# Patient Record
Sex: Female | Born: 1970
Health system: Southern US, Community
[De-identification: ages and names within clinical notes are randomized; demographics above are authoritative.]

## PROBLEM LIST (undated history)

## (undated) DIAGNOSIS — K5090 Crohn's disease, unspecified, without complications: Secondary | ICD-10-CM

## (undated) DIAGNOSIS — T7840XA Allergy, unspecified, initial encounter: Secondary | ICD-10-CM

## (undated) DIAGNOSIS — F1010 Alcohol abuse, uncomplicated: Secondary | ICD-10-CM

## (undated) HISTORY — DX: Allergy, unspecified, initial encounter: T78.40XA

## (undated) HISTORY — DX: Alcohol abuse, uncomplicated: F10.10

## (undated) HISTORY — PX: COLONOSCOPY: SHX174

## (undated) HISTORY — DX: Crohn's disease, unspecified, without complications: K50.90

---

## 2000-04-21 ENCOUNTER — Other Ambulatory Visit: Admission: RE | Admit: 2000-04-21 | Discharge: 2000-04-21 | Payer: Self-pay | Admitting: Gynecology

## 2002-06-07 ENCOUNTER — Other Ambulatory Visit: Admission: RE | Admit: 2002-06-07 | Discharge: 2002-06-07 | Payer: Self-pay | Admitting: Gynecology

## 2003-06-13 ENCOUNTER — Encounter: Admission: RE | Admit: 2003-06-13 | Discharge: 2003-06-28 | Payer: Self-pay | Admitting: Family Medicine

## 2006-05-21 ENCOUNTER — Other Ambulatory Visit: Admission: RE | Admit: 2006-05-21 | Discharge: 2006-05-21 | Payer: Self-pay | Admitting: Gynecology

## 2008-09-07 ENCOUNTER — Encounter: Admission: RE | Admit: 2008-09-07 | Discharge: 2008-09-07 | Payer: Self-pay | Admitting: Gynecology

## 2010-11-15 ENCOUNTER — Encounter
Admission: RE | Admit: 2010-11-15 | Discharge: 2010-11-15 | Payer: Self-pay | Source: Home / Self Care | Attending: Gynecology | Admitting: Gynecology

## 2014-01-23 ENCOUNTER — Ambulatory Visit: Payer: Commercial Managed Care - PPO | Admitting: Family Medicine

## 2014-01-23 VITALS — BP 128/82 | HR 73 | Temp 98.3°F | Resp 17 | Ht 70.0 in | Wt 165.0 lb

## 2014-01-23 DIAGNOSIS — H9202 Otalgia, left ear: Secondary | ICD-10-CM

## 2014-01-23 DIAGNOSIS — J329 Chronic sinusitis, unspecified: Secondary | ICD-10-CM

## 2014-01-23 DIAGNOSIS — J069 Acute upper respiratory infection, unspecified: Secondary | ICD-10-CM

## 2014-01-23 DIAGNOSIS — H9209 Otalgia, unspecified ear: Secondary | ICD-10-CM

## 2014-01-23 MED ORDER — AZITHROMYCIN 250 MG PO TABS: ORAL_TABLET | ORAL | Status: DC

## 2014-01-23 NOTE — Progress Notes (Signed)
      Chief Complaint:  Chief Complaint  Patient presents with  . Sinusitis  . URI  . Cough    HPI: Helen [name] is a 43 y.o. female who is here for  Productive cough yellow, subjective chills/fevers, left ear pressure, and sinus pressure, HA x 2 weeks. Worsening  sxs so came in. She has used mucinex, has used netty pot. Has allergies. Former smoker, last used 4 weeks ago, she quit with acupuncture. She has Crohns disease, has alcohol abuse issues so will not give any narcotics. She has a roommate with similar sxs. NO sore throat. Tried flushing ear out with hydroen peroxide  Past Medical History  Diagnosis Date  . Allergy   . Crohn disease   . Alcohol abuse    Past Surgical History  Procedure Laterality Date  . Colonoscopy     History   Social History  . Marital Status: Single    Spouse Name: N/A    Number of Children: N/A  . Years of Education: N/A   Social History Main Topics  . Smoking status: Former Smoker    Start date: 01/23/1989    Quit date: 10/25/2013  . Smokeless tobacco: None  . Alcohol Use: No  . Drug Use: No  . Sexual Activity: No   Other Topics Concern  . None   Social History Narrative  . None   Family History  Problem Relation Age of Onset  . Mental illness Father    No Known Allergies Prior to Admission medications   Not on File     ROS: The patient denies  night sweats, unintentional weight loss, chest pain, palpitations, wheezing, dyspnea on exertion, nausea, vomiting, abdominal pain, dysuria, hematuria, melena, numbness, weakness, or tingling.   All other systems have been reviewed and were otherwise negative with the exception of those mentioned in the HPI and as above.    PHYSICAL EXAM: Filed Vitals:   01/23/14 0853  BP: 128/82  Pulse: 73  Temp: 98.3 F (36.8 C)  Resp: 17  Spo2 98% Filed Vitals:   01/23/14 0853  Height: 5' 10"  (1.778 m)  Weight: 165 lb (74.844 kg)   Body mass index is 23.68 kg/(m^2).  General: Alert,  no acute distress HEENT:  Normocephalic, atraumatic, oropharynx patent. EOMI, PERRLA, LEft Tm slight erythematous, + sinus tenderness, no exudates Cardiovascular:  Regular rate and rhythm, no rubs murmurs or gallops.  No Carotid bruits, radial pulse intact. No pedal edema.  Respiratory: Clear to auscultation bilaterally.  No wheezes, rales, or rhonchi.  No cyanosis, no use of accessory musculature GI: No organomegaly, abdomen is soft and non-tender, positive bowel sounds.  No masses. Skin: No rashes. Neurologic: Facial musculature symmetric. Psychiatric: Patient is appropriate throughout our interaction. Lymphatic: No cervical lymphadenopathy Musculoskeletal: Gait intact.   LABS: No results found for this or any previous visit.   EKG/XRAY:   Primary read interpreted by Dr. Marin Comment at [facility].   ASSESSMENT/PLAN: Encounter Diagnoses  Name Primary?  . Sinusitis Yes  . URI (upper respiratory infection)   . Otalgia of left ear    Rx Azithromycin Gave sampel of nasonex otc cough meds, no narcotics please! F/u prn  Gross sideeffects, risk and benefits, and alternatives of medications d/w patient. Patient is aware that all medications have potential sideeffects and we are unable to predict every sideeffect or drug-drug interaction that may occur.  LE, [place], DO 01/23/2014 9:48 AM

## 2014-01-23 NOTE — Patient Instructions (Signed)

## 2014-01-23 NOTE — Addendum Note (Signed)
Addended by: Glenford Bayley on: 01/23/2014 10:03 AM   Modules accepted: Orders

## 2014-05-16 ENCOUNTER — Ambulatory Visit (INDEPENDENT_AMBULATORY_CARE_PROVIDER_SITE_OTHER): Payer: Commercial Managed Care - PPO | Admitting: Physician Assistant

## 2014-05-16 VITALS — BP 106/74 | HR 68 | Temp 98.1°F | Resp 16 | Ht 69.5 in | Wt 166.0 lb

## 2014-05-16 DIAGNOSIS — J0110 Acute frontal sinusitis, unspecified: Secondary | ICD-10-CM

## 2014-05-16 DIAGNOSIS — J309 Allergic rhinitis, unspecified: Secondary | ICD-10-CM

## 2014-05-16 MED ORDER — AMOXICILLIN-POT CLAVULANATE 875-125 MG PO TABS: 1.0000 | ORAL_TABLET | Freq: Two times a day (BID) | ORAL | Status: DC

## 2014-05-16 MED ORDER — FLUTICASONE PROPIONATE 50 MCG/ACT NA SUSP: 2.0000 | Freq: Every day | NASAL | Status: DC

## 2014-05-16 MED ORDER — CETIRIZINE HCL 10 MG PO TABS: 10.0000 mg | ORAL_TABLET | Freq: Every day | ORAL | Status: DC

## 2014-05-16 NOTE — Progress Notes (Signed)
   Subjective:    Patient ID: Helen [name], female    DOB: 10-May-1971, 43 y.o.   MRN: [id number]  HPI   Helen [name] is a very pleasant 43 yr old female here with concern for Illness.  She reports cold symptoms for 2 weeks.  Sore throat is her most persistent symptom - +post nasal drainage.  Has had some dizziness.  Slight LEFT ear pain.  Occ facial pain.  "Flashes of fever" - has not taken temp.  Occasional dry cough.  Thought perhaps this was allergies but typically only gets allergies in the fall - did not try any allergy medication.  +fatigue.  Taking tylenol for HA  Pt state she is a "Relapsing smoker" but hasn't smoked in about 3 days  Review of Systems  Constitutional: Positive for fever (subjective) and fatigue. Negative for chills.  HENT: Positive for ear pain, postnasal drip and sore throat. Negative for congestion.   Respiratory: Positive for cough. Negative for shortness of breath and wheezing.   Cardiovascular: Negative.   Gastrointestinal: Negative.   Musculoskeletal: Negative.   Skin: Negative.   Neurological: Positive for headaches.       Objective:   Physical Exam  Vitals reviewed. Constitutional: She is oriented to person, place, and time. She appears well-developed and well-nourished. No distress.  HENT:  Head: Normocephalic.  Right Ear: Tympanic membrane and ear canal normal.  Left Ear: Tympanic membrane and ear canal normal.  Nose: No mucosal edema or rhinorrhea. Right sinus exhibits frontal sinus tenderness. Right sinus exhibits no maxillary sinus tenderness. Left sinus exhibits frontal sinus tenderness. Left sinus exhibits no maxillary sinus tenderness.  Mouth/Throat: Uvula is midline and mucous membranes are normal. Posterior oropharyngeal erythema present. No oropharyngeal exudate or posterior oropharyngeal edema.  Eyes: Conjunctivae are normal. No scleral icterus.  Neck: Neck supple.  Cardiovascular: Normal rate, regular rhythm and normal heart sounds.     Pulmonary/Chest: Effort normal and breath sounds normal. She has no wheezes. She has no rales.  Abdominal: Soft. There is no tenderness.  Lymphadenopathy:    She has no cervical adenopathy.  Neurological: She is alert and oriented to person, place, and time.  Skin: Skin is warm and dry.  Psychiatric: She has a normal mood and affect. Her behavior is normal.       Assessment & Plan:  Acute frontal sinusitis, recurrence not specified - Plan: cetirizine (ZYRTEC) 10 MG tablet, fluticasone (FLONASE) 50 MCG/ACT nasal spray, amoxicillin-clavulanate (AUGMENTIN) 875-125 MG per tablet  Allergic rhinitis, cause unspecified - Plan: cetirizine (ZYRTEC) 10 MG tablet, fluticasone (FLONASE) 50 MCG/ACT nasal spray   Helen [name] is a very pleasant 43 yr old female here with 2 weeks of persistent URI symptoms.  Associated sinus pressure/pain and dizziness.  Will treat for acute sinusitis with augmentin.  Start Zyrtec and Flonase - may continue for allergy control.  Push fluids, rest.  Consider adding neti pot/nasal saline.    Pt to call or RTC if worsening or not improving  E. Natividad Brood MHS, PA-C [facility] 7/14/20158:52 PM

## 2014-05-16 NOTE — Patient Instructions (Signed)
The antibiotic prescribed today is for your present infection only. It is very important to follow the directions for the medication prescribed. Antibiotics are generally given for a specified period of time (7-10 days, for example) to be taken at specific intervals (every 4, 6, 8 or 12 hours). This is necessary to keep the right amount of the medication in the bloodstream. Too much of the medication may cause an adverse reaction, too little may not be completely effective.  To clear your infection completely, continue taking the antibiotic for the full time of treatment, even if you begin to feel better after a few days.  If you miss a dose of the antibiotic, take it as soon as possible. Then go back to your regular dosing schedule. However, don't double up doses.    Take the Augmentin as directed.  Be sure to finish the full course even when you start feeling better.  Take with food to reduce stomach upset  Take the cetirizine (Zyrtec) once daily - this will help dry up post-nasal drainage.  Also use the fluticasone (Flonase) 2 sprays each nostril once daily - this will help with sinus inflammation and ear pressure.  I would continue these medications for a couple of weeks.  You can also use both of these in the fall for allergy symptoms  Warm salt water gargles can soothe the throat.    Ibuprofen and/or Tylenol if needed for pain relief  Plenty of fluids (water is best!) and rest  Please let us know if any symptoms are worsening or not improving   Sinusitis Sinusitis is redness, soreness, and swelling (inflammation) of the paranasal sinuses. Paranasal sinuses are air pockets within the bones of your face (beneath the eyes, the middle of the forehead, or above the eyes). In healthy paranasal sinuses, mucus is able to drain out, and air is able to circulate through them by way of your nose. However, when your paranasal sinuses are inflamed, mucus and air can become trapped. This can allow bacteria  and other germs to grow and cause infection. Sinusitis can develop quickly and last only a short time (acute) or continue over a long period (chronic). Sinusitis that lasts for more than 12 weeks is considered chronic.  CAUSES  Causes of sinusitis include:  Allergies.  Structural abnormalities, such as displacement of the cartilage that separates your nostrils (deviated septum), which can decrease the air flow through your nose and sinuses and affect sinus drainage.  Functional abnormalities, such as when the small hairs (cilia) that line your sinuses and help remove mucus do not work properly or are not present. SYMPTOMS  Symptoms of acute and chronic sinusitis are the same. The primary symptoms are pain and pressure around the affected sinuses. Other symptoms include:  Upper toothache.  Earache.  Headache.  Bad breath.  Decreased sense of smell and taste.  A cough, which worsens when you are lying flat.  Fatigue.  Fever.  Thick drainage from your nose, which often is green and may contain pus (purulent).  Swelling and warmth over the affected sinuses. DIAGNOSIS  Your caregiver will perform a physical exam. During the exam, your caregiver may:  Look in your nose for signs of abnormal growths in your nostrils (nasal polyps).  Tap over the affected sinus to check for signs of infection.  View the inside of your sinuses (endoscopy) with a special imaging device with a light attached (endoscope), which is inserted into your sinuses. If your caregiver suspects that you  have chronic sinusitis, one or more of the following tests may be recommended:  Allergy tests.  Nasal culture--A sample of mucus is taken from your nose and sent to a lab and screened for bacteria.  Nasal cytology--A sample of mucus is taken from your nose and examined by your caregiver to determine if your sinusitis is related to an allergy. TREATMENT  Most cases of acute sinusitis are related to a viral  infection and will resolve on their own within 10 days. Sometimes medicines are prescribed to help relieve symptoms (pain medicine, decongestants, nasal steroid sprays, or saline sprays).  However, for sinusitis related to a bacterial infection, your caregiver will prescribe antibiotic medicines. These are medicines that will help kill the bacteria causing the infection.  Rarely, sinusitis is caused by a fungal infection. In theses cases, your caregiver will prescribe antifungal medicine. For some cases of chronic sinusitis, surgery is needed. Generally, these are cases in which sinusitis recurs more than 3 times per year, despite other treatments. HOME CARE INSTRUCTIONS   Drink plenty of water. Water helps thin the mucus so your sinuses can drain more easily.  Use a humidifier.  Inhale steam 3 to 4 times a day (for example, sit in the bathroom with the shower running).  Apply a warm, moist washcloth to your face 3 to 4 times a day, or as directed by your caregiver.  Use saline nasal sprays to help moisten and clean your sinuses.  Take over-the-counter or prescription medicines for pain, discomfort, or fever only as directed by your caregiver. SEEK IMMEDIATE MEDICAL CARE IF:  You have increasing pain or severe headaches.  You have nausea, vomiting, or drowsiness.  You have swelling around your face.  You have vision problems.  You have a stiff neck.  You have difficulty breathing. MAKE SURE YOU:   Understand these instructions.  Will watch your condition.  Will get help right away if you are not doing well or get worse. Document Released: 10/20/2005 Document Revised: 01/12/2012 Document Reviewed: 11/04/2011 [facility] Patient Information 2015 [place], [place]. This information is not intended to replace advice given to you by your health care provider. Make sure you discuss any questions you have with your health care provider.

## 2014-11-14 ENCOUNTER — Ambulatory Visit (INDEPENDENT_AMBULATORY_CARE_PROVIDER_SITE_OTHER): Payer: Commercial Managed Care - PPO | Admitting: Physician Assistant

## 2014-11-14 VITALS — BP 116/68 | HR 68 | Temp 98.1°F | Resp 18 | Ht 70.5 in | Wt 173.0 lb

## 2014-11-14 DIAGNOSIS — R05 Cough: Secondary | ICD-10-CM

## 2014-11-14 DIAGNOSIS — R058 Other specified cough: Secondary | ICD-10-CM

## 2014-11-14 DIAGNOSIS — R0981 Nasal congestion: Secondary | ICD-10-CM

## 2014-11-14 MED ORDER — IPRATROPIUM BROMIDE 0.03 % NA SOLN: 2.0000 | Freq: Two times a day (BID) | NASAL | Status: DC

## 2014-11-14 MED ORDER — AZITHROMYCIN 250 MG PO TABS: ORAL_TABLET | ORAL | Status: DC

## 2014-11-14 NOTE — Patient Instructions (Signed)
Take antibiotic until finished. Take mucinex regularly. Use nasal spray twice a day. Return in 7-10 days if symptoms are not improving.

## 2014-11-14 NOTE — Progress Notes (Signed)
Subjective:    Patient ID: Helen [name], female    DOB: 14-Aug-1971, 44 y.o.   MRN: [id number]  HPI  This is a 44 year old female with PMH allergic rhinitis who is presenting with 14 days of unchanging productive cough. Cough is worse in the mornings and evenings. It is productive of white/yellow sputum. Nasal congestion started 4 days ago. She does not have nasal drainage. She reports her ears feel full. She has tried acupuncture and mucinex. She has not taken the mucinex consistently. She is a former smoker - most recently quit 2 months ago. She has struggled with quitting for the past 5 years. She does not have a history of asthma. She has environmental allergies in the fall - not bothering her now. She denies facial pain, sore throat, fever, chills, wheezing or SOB.  Review of Systems  Constitutional: Positive for fatigue. Negative for fever and chills.  HENT: Positive for congestion. Negative for ear pain, sinus pressure and sore throat.   Eyes: Negative for redness.  Respiratory: Positive for cough. Negative for shortness of breath and wheezing.   Gastrointestinal: Negative for nausea, vomiting and diarrhea.  Skin: Negative for rash.  Allergic/Immunologic: Positive for environmental allergies.  Hematological: Negative for adenopathy.  Psychiatric/Behavioral: Negative for sleep disturbance.     There are no active problems to display for this patient.  Prior to Admission medications   Medication Sig Start Date End Date Taking? Authorizing Provider  guaiFENesin (MUCINEX) 600 MG 12 hr tablet Take by mouth 2 (two) times daily.   Yes Historical Provider, MD                 No Known Allergies  Patient's social and family history were reviewed.     Objective:   Physical Exam  Constitutional: She is oriented to person, place, and time. She appears well-developed and well-nourished. No distress.  HENT:  Head: Normocephalic and atraumatic.  Right Ear: Hearing, tympanic membrane,  external ear and ear canal normal.  Left Ear: Hearing, tympanic membrane, external ear and ear canal normal.  Nose: Nose normal. Right sinus exhibits no maxillary sinus tenderness and no frontal sinus tenderness. Left sinus exhibits no maxillary sinus tenderness and no frontal sinus tenderness.  Mouth/Throat: Uvula is midline and mucous membranes are normal. Posterior oropharyngeal erythema present. No oropharyngeal exudate or posterior oropharyngeal edema.  Eyes: Conjunctivae and lids are normal. Right eye exhibits no discharge. Left eye exhibits no discharge. No scleral icterus.  Cardiovascular: Normal rate, regular rhythm, normal heart sounds, intact distal pulses and normal pulses.   No murmur heard. Pulmonary/Chest: Effort normal and breath sounds normal. No respiratory distress. She has no wheezes. She has no rhonchi. She has no rales.  Musculoskeletal: Normal range of motion.  Lymphadenopathy:       Head (right side): No submental, no submandibular, no tonsillar, no preauricular and no posterior auricular adenopathy present.       Head (left side): No submental, no submandibular, no tonsillar, no preauricular and no posterior auricular adenopathy present.    She has no cervical adenopathy.  Neurological: She is alert and oriented to person, place, and time.  Skin: Skin is warm, dry and intact. No lesion and no rash noted.  Psychiatric: She has a normal mood and affect. Her speech is normal and behavior is normal. Thought content normal.   BP 116/68 mmHg  Pulse 68  Temp(Src) 98.1 F (36.7 C) (Oral)  Resp 18  Ht 5' 10.5" (1.791 m)  Wt 173 lb (78.472 kg)  BMI 24.46 kg/m2  SpO2 99%  LMP 11/04/2014     Assessment & Plan:  1. Productive cough 2. Nasal congestion Exam normal however due to 14 day duration of unchanging productive cough, will tx with abx. Atrovent for nasal congestion. Will return in 7-10 days if symptoms worsen or fail to improve.  - azithromycin (ZITHROMAX) 250 MG  tablet; Take 2 tabs PO x 1 dose, then 1 tab PO QD x 4 days  Dispense: 6 tablet; Refill: 0 - ipratropium (ATROVENT) 0.03 % nasal spray; Place 2 sprays into both nostrils 2 (two) times daily.  Dispense: 30 mL; Refill: 0   Defne Gerling V. Drenda Freeze, MHS [facility]  11/14/2014

## 2014-11-16 ENCOUNTER — Telehealth: Payer: Self-pay | Admitting: Physician Assistant

## 2014-11-16 ENCOUNTER — Ambulatory Visit (INDEPENDENT_AMBULATORY_CARE_PROVIDER_SITE_OTHER): Payer: Commercial Managed Care - PPO | Admitting: Family Medicine

## 2014-11-16 VITALS — BP 94/70 | HR 60 | Temp 98.5°F | Resp 16 | Ht 70.5 in | Wt 174.2 lb

## 2014-11-16 DIAGNOSIS — R05 Cough: Secondary | ICD-10-CM

## 2014-11-16 DIAGNOSIS — J069 Acute upper respiratory infection, unspecified: Secondary | ICD-10-CM

## 2014-11-16 DIAGNOSIS — R059 Cough, unspecified: Secondary | ICD-10-CM

## 2014-11-16 NOTE — Telephone Encounter (Signed)
Is there a cough suppressant we can call in  Please advise

## 2014-11-16 NOTE — Progress Notes (Signed)
Chief Complaint:  Chief Complaint  Patient presents with  . Follow-up    Cough has gotten worse    HPI: Helen [name] is a 44 y.o. female who is here for recheck on cough, on z pack x 2 days, had coughing spell that made her sob, no wheezing, then went away, she was worried there may be something we are missing. She also felt slightly  Nauseated. She has not really tried ,uch except for mucinex, has an alcohol hx so does not want anything with narcotics in it. She has not tried nasal sprays. She is toerating the z pack well. No fevers or chills. Everythign is better except for cough x 1 episode that scared her   Past Medical History  Diagnosis Date  . Allergy   . Crohn disease   . Alcohol abuse    Past Surgical History  Procedure Laterality Date  . Colonoscopy     History   Social History  . Marital Status: Single    Spouse Name: N/A    Number of Children: N/A  . Years of Education: N/A   Social History Main Topics  . Smoking status: Former Smoker    Types: Cigarettes    Start date: 01/23/1989    Quit date: 10/25/2013  . Smokeless tobacco: Never Used  . Alcohol Use: No  . Drug Use: No  . Sexual Activity: No   Other Topics Concern  . None   Social History Narrative   Family History  Problem Relation Age of Onset  . Mental illness Father    No Known Allergies Prior to Admission medications   Medication Sig Start Date End Date Taking? Authorizing Provider  azithromycin (ZITHROMAX) 250 MG tablet Take 2 tabs PO x 1 dose, then 1 tab PO QD x 4 days 11/14/14  Yes Bennett Scrape V, PA-C  guaiFENesin (MUCINEX) 600 MG 12 hr tablet Take by mouth 2 (two) times daily.   Yes Historical Provider, MD  ipratropium (ATROVENT) 0.03 % nasal spray Place 2 sprays into both nostrils 2 (two) times daily. 11/14/14  Yes Bennett Scrape V, PA-C     ROS: The patient denies fevers, chills, night sweats, unintentional weight loss, chest pain, palpitations, wheezing, dyspnea on exertion,  nausea, vomiting, abdominal pain, dysuria, hematuria, melena, numbness, weakness, or tingling.  All other systems have been reviewed and were otherwise negative with the exception of those mentioned in the HPI and as above.    PHYSICAL EXAM: Filed Vitals:   11/16/14 1904  BP: 94/70  Pulse: 60  Temp: 98.5 F (36.9 C)  Resp: 16   Filed Vitals:   11/16/14 1904  Height: 5' 10.5" (1.791 m)  Weight: 174 lb 4 oz (79.039 kg)   Body mass index is 24.64 kg/(m^2).  General: Alert, no acute distress HEENT:  Normocephalic, atraumatic, oropharynx patent. EOMI, PERRLA Erythematous throat, no exudates, TM normal, + sinus tenderness, + erythematous/boggy nasal mucosa Cardiovascular:  Regular rate and rhythm, no rubs murmurs or gallops.  No Carotid bruits, radial pulse intact. No pedal edema.  Respiratory: Clear to auscultation bilaterally.  No wheezes, rales, or rhonchi.  No cyanosis, no use of accessory musculature GI: No organomegaly, abdomen is soft and non-tender, positive bowel sounds.  No masses. Skin: No rashes. Neurologic: Facial musculature symmetric. Psychiatric: Patient is appropriate throughout our interaction. Lymphatic: No cervical lymphadenopathy Musculoskeletal: Gait intact.   LABS: No results found for this or any previous visit.   EKG/XRAY:   Primary read  interpreted by Dr. Marin Comment at [facility].   ASSESSMENT/PLAN: Encounter Diagnoses  Name Primary?  . Acute upper respiratory infection Yes  . Cough    VSS, discussed option of gettign xray although she was moving air well and no abnl sounds in lungs  on PE.  She decieded not to get xray , will return if worsening sxs Recommend Delsyn, nasocort otc  She does not want any rx narcotic cough syrup F/u prn  Gross sideeffects, risk and benefits, and alternatives of medications d/w patient. Patient is aware that all medications have potential sideeffects and we are unable to predict every sideeffect or drug-drug interaction that  may occur.  Thaddus Mcdowell, [place], DO 11/16/2014 8:06 PM

## 2014-11-16 NOTE — Telephone Encounter (Signed)
Patient states that she is coughing so hard that she is gagging. What can she do?  [phone]

## 2014-11-18 MED ORDER — GUAIFENESIN-CODEINE 100-10 MG/5ML PO SOLN: 5.0000 mL | Freq: Four times a day (QID) | ORAL | Status: DC | PRN

## 2014-11-18 MED ORDER — HYDROCOD POLST-CHLORPHEN POLST 10-8 MG/5ML PO LQCR: 5.0000 mL | Freq: Two times a day (BID) | ORAL | Status: DC | PRN

## 2014-11-18 NOTE — Telephone Encounter (Signed)
Sent in [place] for her cough.

## 2014-11-19 NOTE — Telephone Encounter (Signed)
Pt was seen in office on 11/16/14

## 2014-12-13 ENCOUNTER — Other Ambulatory Visit: Payer: Self-pay | Admitting: Obstetrics and Gynecology

## 2014-12-13 DIAGNOSIS — R928 Other abnormal and inconclusive findings on diagnostic imaging of breast: Secondary | ICD-10-CM

## 2014-12-20 ENCOUNTER — Ambulatory Visit
Admission: RE | Admit: 2014-12-20 | Discharge: 2014-12-20 | Disposition: A | Discharge status: Home / Self Care | Payer: Commercial Managed Care - PPO | Source: Ambulatory Visit | Attending: Obstetrics and Gynecology | Admitting: Obstetrics and Gynecology

## 2014-12-20 DIAGNOSIS — R928 Other abnormal and inconclusive findings on diagnostic imaging of breast: Secondary | ICD-10-CM

## 2015-12-08 ENCOUNTER — Ambulatory Visit (INDEPENDENT_AMBULATORY_CARE_PROVIDER_SITE_OTHER): Payer: Commercial Managed Care - PPO | Admitting: Family Medicine

## 2015-12-08 ENCOUNTER — Encounter: Payer: Self-pay | Admitting: Family Medicine

## 2015-12-08 VITALS — BP 126/84 | HR 65 | Temp 98.8°F | Resp 16 | Ht 71.0 in | Wt 161.0 lb

## 2015-12-08 DIAGNOSIS — R5382 Chronic fatigue, unspecified: Secondary | ICD-10-CM

## 2015-12-08 DIAGNOSIS — R519 Headache, unspecified: Secondary | ICD-10-CM

## 2015-12-08 DIAGNOSIS — H9313 Tinnitus, bilateral: Secondary | ICD-10-CM | POA: Diagnosis not present

## 2015-12-08 DIAGNOSIS — R51 Headache: Secondary | ICD-10-CM | POA: Diagnosis not present

## 2015-12-08 LAB — POCT CBC
Granulocyte percent: 81 %G — AB (ref 37–80)
HCT, POC: 38.2 % (ref 37.7–47.9)
Hemoglobin: 13.7 g/dL (ref 12.2–16.2)
LYMPH, POC: 1.2 (ref 0.6–3.4)
MCH: 33.6 pg — AB (ref 27–31.2)
MCHC: 35.8 g/dL — AB (ref 31.8–35.4)
MCV: 93.9 fL (ref 80–97)
MID (CBC): 0.4 (ref 0–0.9)
MPV: 7.5 fL (ref 0–99.8)
PLATELET COUNT, POC: 193 10*3/uL (ref 142–424)
POC Granulocyte: 6.9 (ref 2–6.9)
POC LYMPH %: 14.5 % (ref 10–50)
POC MID %: 4.5 % (ref 0–12)
RBC: 4.07 M/uL (ref 4.04–5.48)
RDW, POC: 13.5 %
WBC: 8.5 10*3/uL (ref 4.6–10.2)

## 2015-12-08 LAB — COMPREHENSIVE METABOLIC PANEL
ALBUMIN: 4.1 g/dL (ref 3.6–5.1)
ALT: 13 U/L (ref 6–29)
AST: 16 U/L (ref 10–30)
Alkaline Phosphatase: 41 U/L (ref 33–115)
BUN: 8 mg/dL (ref 7–25)
CHLORIDE: 104 mmol/L (ref 98–110)
CO2: 26 mmol/L (ref 20–31)
Calcium: 8.7 mg/dL (ref 8.6–10.2)
Creat: 0.67 mg/dL (ref 0.50–1.10)
Glucose, Bld: 87 mg/dL (ref 65–99)
POTASSIUM: 4.3 mmol/L (ref 3.5–5.3)
Sodium: 137 mmol/L (ref 135–146)
TOTAL PROTEIN: 6.9 g/dL (ref 6.1–8.1)
Total Bilirubin: 0.6 mg/dL (ref 0.2–1.2)

## 2015-12-08 NOTE — Patient Instructions (Signed)
Continue to drink plenty of fluids  You should continue to get enough rest, but also tried to do some daily exercise such as walking or some active movement type exercise.  Take over-the-counter Claritin (loratadine) or Allegra (fexofenadine) 1 daily. I think this may help open the eustachian tubes up and help your ringing in the ear sensation. If that continues to persist too much we can make a referral to an ENT physician.  Return if worse or not improving  I will let you know the results of your remaining labs in a few days

## 2015-12-08 NOTE — Progress Notes (Signed)
Patient ID: Helen [name], female    DOB: 11-23-1970  Age: 45 y.o. MRN: [id number]  Chief Complaint  Patient presents with  . Fatigue    2 weeks  . Sore Throat  . Tinnitus  . Nasal Congestion    Subjective:   Patient is here for a number of things she has been just very fatigued the last several weeks. Not really a chronic fatigue type problem, just a persisting malaise and fatigue. She has a long history of mild tinnitus, but is been worse lately. She had been having a headache. She had a sore throat and nasal congestion. She does not smoke. She's not been febrile.  Current allergies, medications, problem list, past/family and social histories reviewed.  Objective:  BP 126/84 mmHg  Pulse 65  Temp(Src) 98.8 F (37.1 C) (Oral)  Resp 16  Ht 5' 11"  (1.803 m)  Wt 161 lb (73.029 kg)  BMI 22.46 kg/m2  SpO2 98%  LMP 11/18/2015  No major acute obvious problems. TMs appear entirely normal. Throat clear. Neck supple without significant nodes. Chest is clear to auscultation. Heart regular without murmurs. Abdomen soft without mass or tenderness.  Assessment & Plan:   Assessment: 1. Chronic fatigue   2. Tinnitus, bilateral   3. Nonintractable episodic headache, unspecified headache type       Plan: Generalized malaise, probably a viral syndrome. Will check labs and see if there is anything else that shows up. May consider referral to an ENT doctor.  Orders Placed This Encounter  Procedures  . Comprehensive metabolic panel  . POCT CBC    No orders of the defined types were placed in this encounter.         Patient Instructions  Continue to drink plenty of fluids  You should continue to get enough rest, but also tried to do some daily exercise such as walking or some active movement type exercise.  Take over-the-counter Claritin (loratadine) or Allegra (fexofenadine) 1 daily. I think this may help open the eustachian tubes up and help your ringing in the ear sensation.  If that continues to persist too much we can make a referral to an ENT physician.  Return if worse or not improving  I will let you know the results of your remaining labs in a few days     Results for orders placed or performed in visit on 12/08/15  POCT CBC  Result Value Ref Range   WBC 8.5 4.6 - 10.2 K/uL   Lymph, poc 1.2 0.6 - 3.4   POC LYMPH PERCENT 14.5 10 - 50 %L   MID (cbc) 0.4 0 - 0.9   POC MID % 4.5 0 - 12 %M   POC Granulocyte 6.9 2 - 6.9   Granulocyte percent 81.0 (A) 37 - 80 %G   RBC 4.07 4.04 - 5.48 M/uL   Hemoglobin 13.7 12.2 - 16.2 g/dL   HCT, POC 38.2 37.7 - 47.9 %   MCV 93.9 80 - 97 fL   MCH, POC 33.6 (A) 27 - 31.2 pg   MCHC 35.8 (A) 31.8 - 35.4 g/dL   RDW, POC 13.5 %   Platelet Count, POC 193 142 - 424 K/uL   MPV 7.5 0 - 99.8 fL     Return if symptoms worsen or fail to improve.   Salome Hautala, MD 12/08/2015

## 2016-02-11 ENCOUNTER — Other Ambulatory Visit: Payer: Self-pay

## 2016-11-03 HISTORY — PX: LAPAROSCOPIC ILEOCECECTOMY: SHX5898

## 2016-11-27 ENCOUNTER — Encounter: Payer: Self-pay | Admitting: Physician Assistant

## 2016-11-27 ENCOUNTER — Ambulatory Visit (INDEPENDENT_AMBULATORY_CARE_PROVIDER_SITE_OTHER): Payer: Commercial Managed Care - PPO

## 2016-11-27 ENCOUNTER — Ambulatory Visit (INDEPENDENT_AMBULATORY_CARE_PROVIDER_SITE_OTHER): Payer: Commercial Managed Care - PPO | Admitting: Physician Assistant

## 2016-11-27 VITALS — BP 118/80 | HR 80 | Temp 98.5°F | Resp 16 | Ht 71.0 in | Wt 162.0 lb

## 2016-11-27 DIAGNOSIS — R059 Cough, unspecified: Secondary | ICD-10-CM

## 2016-11-27 DIAGNOSIS — R05 Cough: Secondary | ICD-10-CM

## 2016-11-27 LAB — POCT CBC
GRANULOCYTE PERCENT: 72.3 % (ref 37–80)
HCT, POC: 36 % — AB (ref 37.7–47.9)
HEMOGLOBIN: 12.7 g/dL (ref 12.2–16.2)
Lymph, poc: 0.9 (ref 0.6–3.4)
MCH: 32.5 pg — AB (ref 27–31.2)
MCHC: 35.3 g/dL (ref 31.8–35.4)
MCV: 92.2 fL (ref 80–97)
MID (CBC): 0.7 (ref 0–0.9)
MPV: 7.9 fL (ref 0–99.8)
PLATELET COUNT, POC: 150 10*3/uL (ref 142–424)
POC Granulocyte: 4 (ref 2–6.9)
POC LYMPH PERCENT: 15.5 %L (ref 10–50)
POC MID %: 12.2 %M — AB (ref 0–12)
RBC: 3.91 M/uL — AB (ref 4.04–5.48)
RDW, POC: 12.9 %
WBC: 5.6 10*3/uL (ref 4.6–10.2)

## 2016-11-27 MED ORDER — NAPROXEN 500 MG PO TABS: 500.0000 mg | ORAL_TABLET | Freq: Two times a day (BID) | ORAL | 0 refills | Status: DC

## 2016-11-27 MED ORDER — DEXTROMETHORPHAN-GUAIFENESIN 5-100 MG/5ML PO LIQD: ORAL | 0 refills | Status: DC

## 2016-11-27 NOTE — Progress Notes (Signed)
11/27/2016 4:08 PM   DOB: 09-30-1971 / MRN: [id number]  SUBJECTIVE:  Helen [name] is a 46 y.o. female presenting for dry cough in the upper chest that started 4 days ago.  Complains of full body aches along with dizziness. She has tried Mucinex and Tylenol at night.  Feels that this not helping.  Has also tried aleve as well. She has been drinking "copious water." She is a recovering alcoholic and wants to avoid narcotics. Complains of losing her appetite.   No history of asthma. Smoked for 24 years but only 3 cigarettes daily.   There is no immunization history on file for this patient.   She has No Known Allergies.   She  has a past medical history of Alcohol abuse; Allergy; and Crohn disease ([place]).    She  reports that she quit smoking about 3 years ago. Her smoking use included Cigarettes. She started smoking about 27 years ago. She has never used smokeless tobacco. She reports that she does not drink alcohol or use drugs. She  reports that she does not engage in sexual activity. The patient  has a past surgical history that includes Colonoscopy.  Her family history includes Mental illness in her father.  Review of Systems  Constitutional: Positive for malaise/fatigue. Negative for fever.  Respiratory: Positive for cough. Negative for hemoptysis, sputum production, shortness of breath and wheezing.   Gastrointestinal: Negative for abdominal pain and nausea.  Skin: Negative for rash.  Neurological: Positive for dizziness. Negative for sensory change and focal weakness.    The problem list and medications were reviewed and updated by myself where necessary and exist elsewhere in the encounter.   OBJECTIVE:  BP 118/80 (BP Location: Right Arm, Patient Position: Sitting, Cuff Size: Normal)   Pulse 80   Temp 98.5 F (36.9 C) (Oral)   Resp 16   Ht 5' 11"  (1.803 m)   Wt 162 lb (73.5 kg)   LMP 11/29/2015 (Approximate)   SpO2 99%   BMI 22.59 kg/m   Physical Exam  Cardiovascular:  Normal rate and regular rhythm.   Pulmonary/Chest: Effort normal and breath sounds normal.  Musculoskeletal: Normal range of motion.    Results for orders placed or performed in visit on 11/27/16 (from the past 72 hour(s))  POCT CBC     Status: Abnormal   Collection Time: 11/27/16  3:47 PM  Result Value Ref Range   WBC 5.6 4.6 - 10.2 K/uL   Lymph, poc 0.9 0.6 - 3.4   POC LYMPH PERCENT 15.5 10 - 50 %L   MID (cbc) 0.7 0 - 0.9   POC MID % 12.2 (A) 0 - 12 %M   POC Granulocyte 4.0 2 - 6.9   Granulocyte percent 72.3 37 - 80 %G   RBC 3.91 (A) 4.04 - 5.48 M/uL   Hemoglobin 12.7 12.2 - 16.2 g/dL   HCT, POC 36.0 (A) 37.7 - 47.9 %   MCV 92.2 80 - 97 fL   MCH, POC 32.5 (A) 27 - 31.2 pg   MCHC 35.3 31.8 - 35.4 g/dL   RDW, POC 12.9 %   Platelet Count, POC 150 142 - 424 K/uL   MPV 7.9 0 - 99.8 fL    Dg Chest 2 View  Result Date: 11/27/2016 CLINICAL DATA:  Dry cough for 4 days.  All EXAM: CHEST  2 VIEW COMPARISON:  None. FINDINGS: The heart size and mediastinal contours are within normal limits. Both lungs are clear. The visualized skeletal structures are  unremarkable. IMPRESSION: No active cardiopulmonary disease. Electronically Signed   By: Misty Stanley M.D.   On: 11/27/2016 15:53    ASSESSMENT AND PLAN:  Laurelle was seen today for cough, dizziness and generalized body aches.  Diagnoses and all orders for this visit:  Cough: Exam and work up reassuring.  Advise symptomatic treatment for now.  Will see her back if symptoms fail to remiss, change or worsen.  -     DG Chest 2 View; Future -     POCT CBC -     naproxen (NAPROSYN) 500 MG tablet; Take 1 tablet (500 mg total) by mouth 2 (two) times daily with a meal. -     Dextromethorphan-Guaifenesin (DELSYM COUGH/CHEST CONGEST DM) 5-100 MG/5ML LIQD; Use as directed on the package.    The patient is advised to call or return to clinic if she does not see an improvement in symptoms, or to seek the care of the closest emergency department if  she worsens with the above plan.   Philis Fendt, MHS, PA-C [facility] 11/27/2016 4:08 PM

## 2016-11-27 NOTE — Patient Instructions (Signed)
     IF you received an x-ray today, you will receive an invoice from [facility]. Please contact [facility] at [phone] with questions or concerns regarding your invoice.   IF you received labwork today, you will receive an invoice from [facility]. Please contact [facility] at [phone] with questions or concerns regarding your invoice.   Our billing staff will not be able to assist you with questions regarding bills from these companies.  You will be contacted with the lab results as soon as they are available. The fastest way to get your results is to activate your My Chart account. Instructions are located on the last page of this paperwork. If you have not heard from us regarding the results in 2 weeks, please contact this office.     

## 2017-03-28 ENCOUNTER — Ambulatory Visit (INDEPENDENT_AMBULATORY_CARE_PROVIDER_SITE_OTHER): Payer: Commercial Managed Care - PPO | Admitting: Physician Assistant

## 2017-03-28 ENCOUNTER — Encounter: Payer: Self-pay | Admitting: Physician Assistant

## 2017-03-28 VITALS — BP 106/69 | HR 68 | Temp 98.9°F | Resp 18 | Ht 71.0 in | Wt 166.6 lb

## 2017-03-28 DIAGNOSIS — Z131 Encounter for screening for diabetes mellitus: Secondary | ICD-10-CM

## 2017-03-28 DIAGNOSIS — Z23 Encounter for immunization: Secondary | ICD-10-CM

## 2017-03-28 DIAGNOSIS — Z1329 Encounter for screening for other suspected endocrine disorder: Secondary | ICD-10-CM

## 2017-03-28 DIAGNOSIS — Z87898 Personal history of other specified conditions: Secondary | ICD-10-CM

## 2017-03-28 DIAGNOSIS — R42 Dizziness and giddiness: Secondary | ICD-10-CM

## 2017-03-28 MED ORDER — ONDANSETRON 4 MG PO TBDP: 4.0000 mg | ORAL_TABLET | Freq: Once | ORAL | Status: DC

## 2017-03-28 MED ORDER — ONDANSETRON HCL 4 MG PO TABS: 4.0000 mg | ORAL_TABLET | Freq: Three times a day (TID) | ORAL | 0 refills | Status: DC | PRN

## 2017-03-28 MED ORDER — SCOPOLAMINE 1 MG/3DAYS TD PT72: 1.0000 | MEDICATED_PATCH | TRANSDERMAL | 0 refills | Status: DC

## 2017-03-28 NOTE — Patient Instructions (Signed)
     IF you received an x-ray today, you will receive an invoice from [facility]. Please contact [facility] at [phone] with questions or concerns regarding your invoice.   IF you received labwork today, you will receive an invoice from [facility]. Please contact [facility] at [phone] with questions or concerns regarding your invoice.   Our billing staff will not be able to assist you with questions regarding bills from these companies.  You will be contacted with the lab results as soon as they are available. The fastest way to get your results is to activate your My Chart account. Instructions are located on the last page of this paperwork. If you have not heard from us regarding the results in 2 weeks, please contact this office.     

## 2017-03-28 NOTE — Progress Notes (Signed)
03/28/2017 10:14 AM   DOB: February 11, 1971 / MRN: [id number]  SUBJECTIVE:  Helen [name] is a 46 y.o. female presenting for for discussion of motion sickness.  Tells me that she is somewhat dizzy and nauseas today.  This is not a new problem. Has had this in past and was resolved with scopolamine patch and nausea medication.  This started about 3 days ago and is improving.  She has tried meclizine in the and it made her too sleepy. She had no side effects with this treatment plan. She denies HA, falls, change in vision.   Family history of breast cancer on paternal grandmother.  Has been getting mammograms yearly and does not miss these. She has never had an abnormal pap and sees GYN for this.   She sees Dr. Earlean Shawl for treatment of Chron disease thought to be in remission.   She quit smoking 3 years ago.  She smoked about 1 pack a week for about 24 years, thus roughly a 5 pack year history.    She has No Known Allergies.   She  has a past medical history of Alcohol abuse; Allergy; and Crohn disease ([place]).    She  reports that she quit smoking about 3 years ago. Her smoking use included Cigarettes. She started smoking about 28 years ago. She has never used smokeless tobacco. She reports that she does not drink alcohol or use drugs. She  reports that she does not engage in sexual activity. The patient  has a past surgical history that includes Colonoscopy.  Her family history includes Mental illness in her father.  Review of Systems  Constitutional: Negative for chills and fever.  HENT: Negative for congestion.   Cardiovascular: Negative for chest pain, palpitations and leg swelling.  Gastrointestinal: Positive for nausea.  Skin: Negative for itching and rash.  Neurological: Positive for dizziness. Negative for tingling, tremors, sensory change, speech change, focal weakness, seizures, loss of consciousness and headaches.  Psychiatric/Behavioral: Negative for depression.    The problem list and  medications were reviewed and updated by myself where necessary and exist elsewhere in the encounter.   OBJECTIVE:  BP 106/69   Pulse 68   Temp 98.9 F (37.2 C) (Oral)   Resp 18   Ht 5' 11"  (1.803 m)   Wt 166 lb 9.6 oz (75.6 kg)   LMP 03/28/2017   SpO2 97%   BMI 23.24 kg/m   Physical Exam  Constitutional: She is active.  Non-toxic appearance.  Cardiovascular: Normal rate, regular rhythm, S1 normal, S2 normal, normal heart sounds and intact distal pulses.  Exam reveals no gallop, no friction rub and no decreased pulses.   No murmur heard. Pulmonary/Chest: Effort normal. No tachypnea. She has no rales.  Abdominal: She exhibits no distension.  Musculoskeletal: She exhibits no edema.  Neurological: She is alert.  Skin: Skin is warm and dry. She is not diaphoretic. No pallor.   Lab Results  Component Value Date   WBC 5.6 11/27/2016   HGB 12.7 11/27/2016   HCT 36.0 (A) 11/27/2016   MCV 92.2 11/27/2016    Lab Results  Component Value Date   NA 137 12/08/2015   K 4.3 12/08/2015   CL 104 12/08/2015   CO2 26 12/08/2015    Lab Results  Component Value Date   CREATININE 0.67 12/08/2015    Lab Results  Component Value Date   ALT 13 12/08/2015   AST 16 12/08/2015   ALKPHOS 41 12/08/2015   BILITOT 0.6 12/08/2015  No results found for this or any previous visit (from the past 72 hour(s)).  No results found.  ASSESSMENT AND PLAN:  Helen [name] was seen today for medication.  Diagnoses and all orders for this visit:  Dizziness: BPPV with improving symptoms. She is worried that her symptoms will return and she is traveling in the next few days. She has tried meclizine in the past and this made her too sleepy. Starting scopolamine and providing zofran. She is behind on her health care screening and I am filling those today. Sees gyn for paps and breast cancer screenings.  Otherwise doing very well overall.  -     scopolamine (TRANSDERM-SCOP, 1.5 MG,) 1 MG/3DAYS; Place 1  patch (1.5 mg total) onto the skin every 3 (three) days. -     Discontinue: ondansetron (ZOFRAN-ODT) disintegrating tablet 4 mg; Take 1 tablet (4 mg total) by mouth once. -     ondansetron (ZOFRAN) 4 MG tablet; Take 1 tablet (4 mg total) by mouth every 8 (eight) hours as needed for nausea or vomiting.  History of vertigo -     Care order/instruction:  Screening for thyroid disorder -     TSH  Screening for diabetes mellitus -     Hemoglobin A1c  Need for Tdap vaccination -     Tdap vaccine greater than or equal to 7yo IM    The patient is advised to call or return to clinic if she does not see an improvement in symptoms, or to seek the care of the closest emergency department if she worsens with the above plan.   Philis Fendt, MHS, PA-C [facility] 03/28/2017 10:14 AM

## 2017-03-29 LAB — HEMOGLOBIN A1C
ESTIMATED AVERAGE GLUCOSE: 105 mg/dL
HEMOGLOBIN A1C: 5.3 % (ref 4.8–5.6)

## 2017-03-29 LAB — TSH: TSH: 3.24 u[IU]/mL (ref 0.450–4.500)

## 2017-03-30 NOTE — Progress Notes (Signed)
Please make patient aware of results via letter. In the context of her overall presentation any abnormal values are of no clinical significance.  Philis Fendt PA-C, 03/30/2017 10:38 AM

## 2017-03-31 ENCOUNTER — Encounter: Payer: Self-pay | Admitting: Radiology

## 2017-08-26 ENCOUNTER — Inpatient Hospital Stay (HOSPITAL_COMMUNITY)
Admission: EM | Admit: 2017-08-26 | Discharge: 2017-09-01 | DRG: 387 | Disposition: A | Discharge status: Home / Self Care | Payer: Commercial Managed Care - PPO | Attending: Internal Medicine | Admitting: Internal Medicine

## 2017-08-26 ENCOUNTER — Encounter (HOSPITAL_COMMUNITY): Payer: Self-pay | Admitting: Emergency Medicine

## 2017-08-26 ENCOUNTER — Emergency Department (HOSPITAL_COMMUNITY): Payer: Commercial Managed Care - PPO

## 2017-08-26 DIAGNOSIS — F17210 Nicotine dependence, cigarettes, uncomplicated: Secondary | ICD-10-CM | POA: Diagnosis present

## 2017-08-26 DIAGNOSIS — K56609 Unspecified intestinal obstruction, unspecified as to partial versus complete obstruction: Secondary | ICD-10-CM | POA: Diagnosis present

## 2017-08-26 DIAGNOSIS — R197 Diarrhea, unspecified: Secondary | ICD-10-CM | POA: Diagnosis not present

## 2017-08-26 DIAGNOSIS — K50012 Crohn's disease of small intestine with intestinal obstruction: Principal | ICD-10-CM | POA: Diagnosis present

## 2017-08-26 DIAGNOSIS — F1011 Alcohol abuse, in remission: Secondary | ICD-10-CM

## 2017-08-26 DIAGNOSIS — K50912 Crohn's disease, unspecified, with intestinal obstruction: Secondary | ICD-10-CM | POA: Diagnosis not present

## 2017-08-26 DIAGNOSIS — R110 Nausea: Secondary | ICD-10-CM

## 2017-08-26 DIAGNOSIS — K50914 Crohn's disease, unspecified, with abscess: Secondary | ICD-10-CM | POA: Diagnosis not present

## 2017-08-26 DIAGNOSIS — R1031 Right lower quadrant pain: Secondary | ICD-10-CM | POA: Diagnosis not present

## 2017-08-26 DIAGNOSIS — R112 Nausea with vomiting, unspecified: Secondary | ICD-10-CM | POA: Diagnosis not present

## 2017-08-26 DIAGNOSIS — Z87898 Personal history of other specified conditions: Secondary | ICD-10-CM | POA: Diagnosis not present

## 2017-08-26 LAB — COMPREHENSIVE METABOLIC PANEL
ALBUMIN: 4.2 g/dL (ref 3.5–5.0)
ALT: 12 U/L — ABNORMAL LOW (ref 14–54)
AST: 23 U/L (ref 15–41)
Alkaline Phosphatase: 42 U/L (ref 38–126)
Anion gap: 12 (ref 5–15)
BUN: 14 mg/dL (ref 6–20)
CHLORIDE: 105 mmol/L (ref 101–111)
CO2: 20 mmol/L — AB (ref 22–32)
Calcium: 8.9 mg/dL (ref 8.9–10.3)
Creatinine, Ser: 0.88 mg/dL (ref 0.44–1.00)
GFR calc Af Amer: >60 mL/min (ref >60)
GFR calc non Af Amer: >60 mL/min (ref >60)
GLUCOSE: 141 mg/dL — AB (ref 65–99)
POTASSIUM: 4.4 mmol/L (ref 3.5–5.1)
SODIUM: 137 mmol/L (ref 135–145)
Total Bilirubin: 1.1 mg/dL (ref 0.3–1.2)
Total Protein: 7.2 g/dL (ref 6.5–8.1)

## 2017-08-26 LAB — URINALYSIS, ROUTINE W REFLEX MICROSCOPIC
BACTERIA UA: NONE SEEN
BILIRUBIN URINE: NEGATIVE
Glucose, UA: NEGATIVE mg/dL
Ketones, ur: 20 mg/dL — AB
Leukocytes, UA: NEGATIVE
Nitrite: NEGATIVE
PROTEIN: NEGATIVE mg/dL
pH: 5 (ref 5.0–8.0)

## 2017-08-26 LAB — CBC
HCT: 33.7 % — ABNORMAL LOW (ref 36.0–46.0)
HEMATOCRIT: 37.8 % (ref 36.0–46.0)
Hemoglobin: 13.1 g/dL (ref 12.0–15.0)
Hemoglobin: 11.7 g/dL — ABNORMAL LOW (ref 12.0–15.0)
MCH: 31.8 pg (ref 26.0–34.0)
MCH: 31.9 pg (ref 26.0–34.0)
MCHC: 34.7 g/dL (ref 30.0–36.0)
MCHC: 34.7 g/dL (ref 30.0–36.0)
MCV: 91.7 fL (ref 78.0–100.0)
MCV: 91.8 fL (ref 78.0–100.0)
PLATELETS: 155 10*3/uL (ref 150–400)
Platelets: 146 10*3/uL — ABNORMAL LOW (ref 150–400)
RBC: 4.12 MIL/uL (ref 3.87–5.11)
RBC: 3.67 MIL/uL — AB (ref 3.87–5.11)
RDW: 13.4 % (ref 11.5–15.5)
RDW: 13.6 % (ref 11.5–15.5)
WBC: 20.8 10*3/uL — ABNORMAL HIGH (ref 4.0–10.5)
WBC: 15 10*3/uL — ABNORMAL HIGH (ref 4.0–10.5)

## 2017-08-26 LAB — CREATININE, SERUM
CREATININE: 0.73 mg/dL (ref 0.44–1.00)
GFR calc Af Amer: >60 mL/min (ref >60)

## 2017-08-26 LAB — I-STAT BETA HCG BLOOD, ED (MC, WL, AP ONLY)

## 2017-08-26 LAB — LIPASE, BLOOD: LIPASE: 22 U/L (ref 11–51)

## 2017-08-26 MED ORDER — IOPAMIDOL (ISOVUE-300) INJECTION 61%
100.0000 mL | Freq: Once | INTRAVENOUS | Status: AC | PRN
  Administered 2017-08-26: 100 mL via INTRAVENOUS

## 2017-08-26 MED ORDER — ADULT MULTIVITAMIN W/MINERALS CH
1.0000 | ORAL_TABLET | Freq: Every day | ORAL | Status: DC
  Administered 2017-08-27 – 2017-09-01 (×6): 1 via ORAL
  Filled 2017-08-26 (×7): qty 1

## 2017-08-26 MED ORDER — IOPAMIDOL (ISOVUE-300) INJECTION 61%
30.0000 mL | Freq: Once | INTRAVENOUS | Status: AC | PRN
  Administered 2017-08-26: 30 mL via ORAL

## 2017-08-26 MED ORDER — HYDRALAZINE HCL 20 MG/ML IJ SOLN: 10.0000 mg | Freq: Three times a day (TID) | INTRAMUSCULAR | Status: DC | PRN

## 2017-08-26 MED ORDER — ACETAMINOPHEN 325 MG PO TABS: 650.0000 mg | ORAL_TABLET | Freq: Four times a day (QID) | ORAL | Status: DC | PRN

## 2017-08-26 MED ORDER — THIAMINE HCL 100 MG/ML IJ SOLN
100.0000 mg | Freq: Every day | INTRAMUSCULAR | Status: DC
  Administered 2017-08-27: 100 mg via INTRAVENOUS
  Filled 2017-08-26 (×2): qty 2

## 2017-08-26 MED ORDER — METHYLPREDNISOLONE SODIUM SUCC 40 MG IJ SOLR
20.0000 mg | Freq: Four times a day (QID) | INTRAMUSCULAR | Status: DC
  Administered 2017-08-26 – 2017-08-27 (×3): 20 mg via INTRAVENOUS
  Administered 2017-08-27: 40 mg via INTRAVENOUS
  Administered 2017-08-27 – 2017-09-01 (×19): 20 mg via INTRAVENOUS
  Filled 2017-08-26 (×22): qty 1

## 2017-08-26 MED ORDER — DEXTROSE 5 % IV SOLN
2.0000 g | INTRAVENOUS | Status: DC
  Administered 2017-08-27 – 2017-09-01 (×6): 2 g via INTRAVENOUS
  Filled 2017-08-26 (×6): qty 2

## 2017-08-26 MED ORDER — FOLIC ACID 1 MG PO TABS
1.0000 mg | ORAL_TABLET | Freq: Every day | ORAL | Status: DC
  Administered 2017-08-27 – 2017-09-01 (×6): 1 mg via ORAL
  Filled 2017-08-26 (×7): qty 1

## 2017-08-26 MED ORDER — DEXTROSE 5 % IV SOLN
2.0000 g | Freq: Once | INTRAVENOUS | Status: AC
  Administered 2017-08-26: 2 g via INTRAVENOUS
  Filled 2017-08-26: qty 2

## 2017-08-26 MED ORDER — FENTANYL CITRATE (PF) 100 MCG/2ML IJ SOLN
50.0000 ug | Freq: Once | INTRAMUSCULAR | Status: AC
  Administered 2017-08-26: 50 ug via INTRAVENOUS
  Filled 2017-08-26: qty 2

## 2017-08-26 MED ORDER — DIPHENHYDRAMINE HCL 50 MG/ML IJ SOLN
25.0000 mg | Freq: Two times a day (BID) | INTRAMUSCULAR | Status: DC | PRN
Start: 2017-08-26 — End: 2017-08-31
  Administered 2017-08-26 – 2017-08-30 (×5): 25 mg via INTRAVENOUS
  Filled 2017-08-26 (×5): qty 1

## 2017-08-26 MED ORDER — HYDROMORPHONE HCL 1 MG/ML IJ SOLN: 1.0000 mg | INTRAMUSCULAR | Status: DC | PRN

## 2017-08-26 MED ORDER — LORAZEPAM 1 MG PO TABS: 1.0000 mg | ORAL_TABLET | Freq: Four times a day (QID) | ORAL | Status: DC | PRN

## 2017-08-26 MED ORDER — HEPARIN SODIUM (PORCINE) 5000 UNIT/ML IJ SOLN
5000.0000 [IU] | Freq: Three times a day (TID) | INTRAMUSCULAR | Status: DC
  Administered 2017-08-27 – 2017-08-28 (×3): 5000 [IU] via SUBCUTANEOUS
  Filled 2017-08-26 (×8): qty 1

## 2017-08-26 MED ORDER — METRONIDAZOLE IN NACL 5-0.79 MG/ML-% IV SOLN
500.0000 mg | Freq: Three times a day (TID) | INTRAVENOUS | Status: DC
  Administered 2017-08-26 – 2017-09-01 (×18): 500 mg via INTRAVENOUS
  Filled 2017-08-26 (×19): qty 100

## 2017-08-26 MED ORDER — VITAMIN B-1 100 MG PO TABS
100.0000 mg | ORAL_TABLET | Freq: Every day | ORAL | Status: DC
  Administered 2017-08-28 – 2017-09-01 (×5): 100 mg via ORAL
  Filled 2017-08-26 (×7): qty 1

## 2017-08-26 MED ORDER — ONDANSETRON HCL 4 MG/2ML IJ SOLN
4.0000 mg | Freq: Once | INTRAMUSCULAR | Status: AC | PRN
  Administered 2017-08-26: 4 mg via INTRAVENOUS
  Filled 2017-08-26: qty 2

## 2017-08-26 MED ORDER — SODIUM CHLORIDE 0.9 % IV SOLN
INTRAVENOUS | Status: AC
  Administered 2017-08-26: 17:00:00 via INTRAVENOUS

## 2017-08-26 MED ORDER — SODIUM CHLORIDE 0.9 % IV BOLUS (SEPSIS)
1000.0000 mL | Freq: Once | INTRAVENOUS | Status: AC
  Administered 2017-08-26: 1000 mL via INTRAVENOUS

## 2017-08-26 MED ORDER — ACETAMINOPHEN 650 MG RE SUPP: 650.0000 mg | Freq: Four times a day (QID) | RECTAL | Status: DC | PRN

## 2017-08-26 MED ORDER — ONDANSETRON HCL 4 MG/2ML IJ SOLN: 4.0000 mg | Freq: Four times a day (QID) | INTRAMUSCULAR | Status: DC | PRN

## 2017-08-26 MED ORDER — METRONIDAZOLE IN NACL 5-0.79 MG/ML-% IV SOLN
500.0000 mg | Freq: Once | INTRAVENOUS | Status: AC
  Administered 2017-08-26: 500 mg via INTRAVENOUS
  Filled 2017-08-26: qty 100

## 2017-08-26 MED ORDER — IOPAMIDOL (ISOVUE-300) INJECTION 61%
INTRAVENOUS | Status: AC
  Filled 2017-08-26: qty 30

## 2017-08-26 MED ORDER — IOPAMIDOL (ISOVUE-300) INJECTION 61%
INTRAVENOUS | Status: AC
  Administered 2017-08-26: 100 mL via INTRAVENOUS
  Filled 2017-08-26: qty 100

## 2017-08-26 MED ORDER — LORAZEPAM 2 MG/ML IJ SOLN: 1.0000 mg | Freq: Four times a day (QID) | INTRAMUSCULAR | Status: DC | PRN

## 2017-08-26 NOTE — ED Notes (Signed)
ED Provider at bedside. 

## 2017-08-26 NOTE — ED Provider Notes (Addendum)
[facility] DEPT Provider Note   CSN: [id number] Arrival date & time: 08/26/17  4944     History   Chief Complaint Chief Complaint  Patient presents with  . Abdominal Pain    HPI Helen [name] is a 46 y.o. female.  HPI Patient presents with abdominal pain. Began across the midabdomen yesterday. Not localized right lower quadrant. States it is relentless and severe. States walking hurts and is difficult for her to do. Has had vomiting and just little bit of diarrhea. Has history of Crohn's disease but states that she has been in remission since she was 46 years old. Does see Dr. Earlean Shawl for it.previous history of alcohol abuse but has not drank in 17 years. No vaginal bleeding or discharge. Denies possibility of pregnancy. Has had decreased appetite. No fevers. States she's been having dry heaves. Past Medical History:  Diagnosis Date  . Alcohol abuse   . Allergy   . Crohn disease ([place])     There are no active problems to display for this patient.   Past Surgical History:  Procedure Laterality Date  . COLONOSCOPY      OB History    No data available       Home Medications    Prior to Admission medications   Medication Sig Start Date End Date Taking? Authorizing Provider  ibuprofen (ADVIL,MOTRIN) 200 MG tablet Take 400 mg by mouth every 6 (six) hours as needed for fever, headache, mild pain, moderate pain or cramping.   Yes [provider]    Family History Family History  Problem Relation Age of Onset  . Mental illness Father   . Aneurysm Father   . Cancer Paternal Grandmother     Social History Social History  Substance Use Topics  . Smoking status: Current Every Day Smoker    Types: Cigarettes    Start date: 01/23/1989    Last attempt to quit: 10/25/2013  . Smokeless tobacco: Never Used  . Alcohol use No     Comment: former      Allergies   Other   Review of Systems Review of Systems  Constitutional:  Positive for appetite change.  HENT: Negative for congestion.   Respiratory: Negative for shortness of breath.   Cardiovascular: Negative for chest pain.  Gastrointestinal: Positive for abdominal pain, diarrhea, nausea and vomiting.  Genitourinary: Negative for dysuria.  Musculoskeletal: Negative for back pain.  Skin: Negative for rash.  Neurological: Negative for weakness and numbness.  Psychiatric/Behavioral: Negative for confusion.     Physical Exam Updated Vital Signs BP 122/64   Pulse 75   Temp 98 F (36.7 C) (Oral)   Resp 15   Ht 5' 11"  (1.803 m)   Wt 72.6 kg (160 lb)   LMP 07/29/2017 (Approximate)   SpO2 99%   BMI 22.32 kg/m   Physical Exam  Constitutional: She appears well-developed.  HENT:  Head: Atraumatic.  Eyes: Pupils are equal, round, and reactive to light.  Cardiovascular: Normal rate.   Pulmonary/Chest: Effort normal.  Abdominal: Soft. There is tenderness. There is guarding.  Moderate tenderness to right lower quadrant. Pain with jostling of the bed or impact to the heel of the right foot.  Musculoskeletal: She exhibits no edema.  Neurological: She is alert.  Skin: Skin is warm. Capillary refill takes less than 2 seconds.     ED Treatments / Results  Labs (all labs ordered are listed, but only abnormal results are displayed) Labs Reviewed  COMPREHENSIVE METABOLIC PANEL -  Abnormal; Notable for the following:       Result Value   CO2 20 (*)    Glucose, Bld 141 (*)    ALT 12 (*)    All other components within normal limits  CBC - Abnormal; Notable for the following:    WBC 20.8 (*)    Platelets 146 (*)    All other components within normal limits  URINALYSIS, ROUTINE W REFLEX MICROSCOPIC - Abnormal; Notable for the following:    Specific Gravity, Urine >1.046 (*)    Hgb urine dipstick SMALL (*)    Ketones, ur 20 (*)    Squamous Epithelial / LPF 0-5 (*)    All other components within normal limits  LIPASE, BLOOD  I-STAT BETA HCG BLOOD, ED  ([facility], [facility], [facility] ONLY)    EKG  EKG Interpretation None       Radiology Ct Abdomen Pelvis W Contrast  Addendum Date: 08/26/2017   ADDENDUM REPORT: 08/26/2017 14:55 ADDENDUM: On further review, the 9 cm rim enhancing collection containing stool in the right lower quadrant could represent fecalized material within the distal small bowel lumen, rather than an adjacent extraluminal abscess. Delayed CT images to allow passage of oral contrast material may be helpful for further evaluation of this finding. Electronically Signed   By: Earle Gell M.D.   On: 08/26/2017 14:55   Result Date: 08/26/2017 CLINICAL DATA:  Right lower quadrant pain beginning yesterday. Nausea and vomiting. Diarrhea. Crohn's disease. EXAM: CT ABDOMEN AND PELVIS WITH CONTRAST TECHNIQUE: Multidetector CT imaging of the abdomen and pelvis was performed using the standard protocol following bolus administration of intravenous contrast. CONTRAST:  100 mL Isovue-300 COMPARISON:  None. FINDINGS: Lower Chest: No acute findings. Hepatobiliary: Several low-attenuation liver lesions are seen, largest in the central right hepatic lobe adjacent to the right portal vein measuring 1.6 cm. These have nonspecific features. Gallbladder is unremarkable. Pancreas:  No mass or inflammatory changes. Spleen: Within normal limits in size and appearance. Adrenals/Urinary Tract: No masses identified. No evidence of hydronephrosis. Stomach/Bowel: A distal small bowel obstruction is seen with transition point in the terminal ileum. Moderate wall thickening and mucosal enhancement is seen involving terminal ileum with adjacent inflammatory changes, consistent with Crohn's disease. In addition, there is a rim enhancing fluid collection in the right lower quadrant which contains stool and measures 9.3 by 5.3 x 4.6 cm. This is consistent with abscess. Vascular/Lymphatic: No pathologically enlarged lymph nodes. No abdominal aortic aneurysm. Reproductive: No mass or  other significant abnormality. Tiny amount of free fluid noted in pelvis. Other:  None. Musculoskeletal:  No suspicious bone lesions identified. IMPRESSION: Distal small bowel obstruction due to moderate wall thickening and inflammatory changes involving the terminal ileum, consistent with active Crohn's disease. 9 x 5 x 5 cm rim enhancing collection containing stool in right lower quadrant, consistent with abscess. Several indeterminate low-attenuation liver lesions, largest measuring 1.6 cm. Nonemergent abdomen MRI without and with contrast is recommended for further characterization. Electronically Signed: By: Earle Gell M.D. On: 08/26/2017 12:15    Procedures Procedures (including critical care time)  Medications Ordered in ED Medications  iopamidol (ISOVUE-300) 61 % injection (not administered)  ondansetron (ZOFRAN) injection 4 mg (4 mg Intravenous Given 08/26/17 0818)  fentaNYL (SUBLIMAZE) injection 50 mcg (50 mcg Intravenous Given 08/26/17 0818)  sodium chloride 0.9 % bolus 1,000 mL (0 mLs Intravenous Stopped 08/26/17 0929)  cefTRIAXone (ROCEPHIN) 2 g in dextrose 5 % 50 mL IVPB (0 g Intravenous Stopped 08/26/17 0907)  And  metroNIDAZOLE (FLAGYL) IVPB 500 mg (0 mg Intravenous Stopped 08/26/17 1017)  iopamidol (ISOVUE-300) 61 % injection 30 mL (30 mLs Oral Contrast Given 08/26/17 0931)  fentaNYL (SUBLIMAZE) injection 50 mcg (50 mcg Intravenous Given 08/26/17 1022)  iopamidol (ISOVUE-300) 61 % injection 100 mL (100 mLs Intravenous Contrast Given 08/26/17 1144)  fentaNYL (SUBLIMAZE) injection 50 mcg (50 mcg Intravenous Given 08/26/17 1252)     Initial Impression / Assessment and Plan / ED Course  I have reviewed the triage vital signs and the nursing notes.  Pertinent labs & imaging results that were available during my care of the patient were reviewed by me and considered in my medical decision making (see chart for details).     Patient presents with abdominal pain.  Had had  pain for last couple days no localized right lower quadrant.  White count is elevated at 20.  CT scan shows bowel obstruction and Crohn's disease and large abscess.  Discussed with general surgery.  Patient but with the Crohn's disease recommends admission to internal medicine.  Also sees Dr. Earlean Shawl from gastroenterology.  Final Clinical Impressions(s) / ED Diagnoses   Final diagnoses:  Crohn's disease with abscess, unspecified gastrointestinal tract location [facility])  Intestinal obstruction, unspecified cause, unspecified whether partial or complete [facility])    New Prescriptions New Prescriptions   No medications on file     Davonna Belling, MD 08/26/17 1236  Has been seen by general surgery.  They are not sure this is an abscess thinks it could be obstruction.  Patient will be admitted to internal medicine.  Also discussed with Dr. Earlean Shawl, [phone] who will see the patient, in the hospital.    Davonna Belling, MD 08/26/17 [phone]

## 2017-08-26 NOTE — Progress Notes (Signed)
I talked to Dr. Earlean Shawl and let him know what was occurring and formally ask for a consult.

## 2017-08-26 NOTE — H&P (Signed)
[facility] History and Physical  [name] HDQ:[id number] DOB: [birth year] DOA: 08/26/2017  Referring physician:  PCP: Patient, No Pcp Per   Chief Complaint: "I thought it was period cramping."  HPI: Helen [name] is a 46 y.o. female with past medical history of alcohol abuse, allergies and Crohn's presents to the emergency room at the abdominal pain.  Patient states she has had abdominal pain for at least 3 days.  It has gradually worsened over the last few days.  Multiple episodes of nausea and vomiting.  Vomitus was nonbloody nonbilious.  Denies seeing contacts and suspicious ingestions.  No recent medication changes.  Denies any alcohol abuse.  Denies drinking altogether.  Pt's GI Dr. Earlean Shawl, [phone]   Review of Systems:  As per HPI otherwise 10 point review of systems negative.    Past Medical History:  Diagnosis Date  . Alcohol abuse   . Allergy   . Crohn disease [facility])    Past Surgical History:  Procedure Laterality Date  . COLONOSCOPY     Social History:  reports that she quit smoking about 3 years ago. Her smoking use included Cigarettes. She started smoking about 28 years ago. She uses smokeless tobacco. She reports that she does not drink alcohol or use drugs.  Allergies  Allergen Reactions  . Other     Recovering alcoholic - prefers no narcotic's     Family History  Problem Relation Age of Onset  . Mental illness Father   . Aneurysm Father   . Cancer Paternal Grandmother      Prior to Admission medications   Medication Sig Start Date End Date Taking? Authorizing Provider  ibuprofen (ADVIL,MOTRIN) 200 MG tablet Take 400 mg by mouth every 6 (six) hours as needed for fever, headache, mild pain, moderate pain or cramping.   Yes [provider]   Physical Exam: Vitals:   08/26/17 1430 08/26/17 1500 08/26/17 1530 08/26/17 1600  BP: 106/67 (!) 108/57 114/83   Pulse: 74 76 76 73  Resp:  16    Temp:      TempSrc:      SpO2: 99% 100% 99%  99%  Weight:      Height:        Wt Readings from Last 3 Encounters:  08/26/17 72.6 kg (160 lb)  03/28/17 75.6 kg (166 lb 9.6 oz)  11/27/16 73.5 kg (162 lb)    General:  Appears calm and comfortable, mild/mod dehydrated Eyes:  PERRL, EOMI, normal lids, iris ENT:  grossly normal hearing, lips & tongue Neck:  no LAD, masses or thyromegaly Cardiovascular:  RRR, no m/r/g. No LE edema.  Respiratory:  CTA bilaterally, no w/r/r. Normal respiratory effort. Abdomen:  soft, RLQ fullness, no rebound, no rigidity Skin:  no rash or induration seen on limited exam Musculoskeletal:  grossly normal tone BUE/BLE Psychiatric:  grossly normal mood and affect, speech fluent and appropriate Neurologic:  CN 2-12 grossly intact, moves all extremities in coordinated fashion.          Labs on Admission:  Basic Metabolic Panel:  Recent Labs Lab 08/26/17 0735  NA 137  K 4.4  CL 105  CO2 20*  GLUCOSE 141*  BUN 14  CREATININE 0.88  CALCIUM 8.9   Liver Function Tests:  Recent Labs Lab 08/26/17 0735  AST 23  ALT 12*  ALKPHOS 42  BILITOT 1.1  PROT 7.2  ALBUMIN 4.2    Recent Labs Lab 08/26/17 0735  LIPASE 22   No results for input(s):  AMMONIA in the last 168 hours. CBC:  Recent Labs Lab 08/26/17 0735  WBC 20.8*  HGB 13.1  HCT 37.8  MCV 91.7  PLT 146*   Cardiac Enzymes: No results for input(s): CKTOTAL, CKMB, CKMBINDEX, TROPONINI in the last 168 hours.  BNP (last 3 results) No results for input(s): BNP in the last 8760 hours.  ProBNP (last 3 results) No results for input(s): PROBNP in the last 8760 hours.   Serum creatinine: 0.88 mg/dL 08/26/17 0735 Estimated creatinine clearance: 91.2 mL/min  CBG: No results for input(s): GLUCAP in the last 168 hours.  Radiological Exams on Admission: Ct Abdomen Pelvis W Contrast  Addendum Date: 08/26/2017   ADDENDUM REPORT: 08/26/2017 14:55 ADDENDUM: On further review, the 9 cm rim enhancing collection containing stool in the  right lower quadrant could represent fecalized material within the distal small bowel lumen, rather than an adjacent extraluminal abscess. Delayed CT images to allow passage of oral contrast material may be helpful for further evaluation of this finding. Electronically Signed   By: Earle Gell M.D.   On: 08/26/2017 14:55   Result Date: 08/26/2017 CLINICAL DATA:  Right lower quadrant pain beginning yesterday. Nausea and vomiting. Diarrhea. Crohn's disease. EXAM: CT ABDOMEN AND PELVIS WITH CONTRAST TECHNIQUE: Multidetector CT imaging of the abdomen and pelvis was performed using the standard protocol following bolus administration of intravenous contrast. CONTRAST:  100 mL Isovue-300 COMPARISON:  None. FINDINGS: Lower Chest: No acute findings. Hepatobiliary: Several low-attenuation liver lesions are seen, largest in the central right hepatic lobe adjacent to the right portal vein measuring 1.6 cm. These have nonspecific features. Gallbladder is unremarkable. Pancreas:  No mass or inflammatory changes. Spleen: Within normal limits in size and appearance. Adrenals/Urinary Tract: No masses identified. No evidence of hydronephrosis. Stomach/Bowel: A distal small bowel obstruction is seen with transition point in the terminal ileum. Moderate wall thickening and mucosal enhancement is seen involving terminal ileum with adjacent inflammatory changes, consistent with Crohn's disease. In addition, there is a rim enhancing fluid collection in the right lower quadrant which contains stool and measures 9.3 by 5.3 x 4.6 cm. This is consistent with abscess. Vascular/Lymphatic: No pathologically enlarged lymph nodes. No abdominal aortic aneurysm. Reproductive: No mass or other significant abnormality. Tiny amount of free fluid noted in pelvis. Other:  None. Musculoskeletal:  No suspicious bone lesions identified. IMPRESSION: Distal small bowel obstruction due to moderate wall thickening and inflammatory changes involving the  terminal ileum, consistent with active Crohn's disease. 9 x 5 x 5 cm rim enhancing collection containing stool in right lower quadrant, consistent with abscess. Several indeterminate low-attenuation liver lesions, largest measuring 1.6 cm. Nonemergent abdomen MRI without and with contrast is recommended for further characterization. Electronically Signed: By: Earle Gell M.D. On: 08/26/2017 12:15    EKG: pending  Assessment/Plan Active Problems:   Bowel obstruction (HCC)  Bowel obstruction GI consulted, will salicylate and steroid decisions to GI Gen Surg consulted MIVF NPO Continue flagyl and rocephin  Hx of ETOH abuse CIWA protocol   Code Status: FULL  DVT Prophylaxis: lovenox Family Communication: parents at bedside Disposition Plan: Pending Improvement  Status: inpt medsurg  Elwin Mocha, MD Family Medicine [facility] [email or web address] Password [email or web address]

## 2017-08-26 NOTE — Consult Note (Signed)
Reason for Consult: Right lower quadrant abscess/Crohn's disease Referring Physician: Robyn Haber GI: Dr. Earlean Shawl MBW:[id number], No Pcp Per   Helen [name] is an 46 y.o. female.  HPI: Patient is a 46 year old female with a history of Crohn's disease in remission for 17 years.  She presents with severe pain in the right lower quadrant.  Nausea and vomiting.  Workup in the emergency department shows she is afebrile, vital signs are stable.  Labs show a normal CMP except for a CO2 of 20, glucose of 141 and ALT of 12.  CBC is 20,800, hemoglobin is 13.1, hematocrit 37.8, platelets 146,000.  HCG is negative.  CT scan was read as showing a distal small bowel obstruction with a transition point in the terminal ileum.  Moderate wall thickening and mucosal enhancement seen in the terminal ileum with adjacent inflammatory changes consistent with Crohn's disease.  In addition there is a rim-enhancing fluid collection in the right lower quadrant which contains stool and measures 9.3 x 5.3 x 4.6 cm consistent with an abscess.  Review of the CT with Dr. Excell Seltzer and radiology suggest that she has a significant ileocecal obstruction and that she has allot of small bowel distension and fecalization.   We are asked to see.  Past Medical History:  Diagnosis Date  . Alcohol abuse   . Allergy   . Crohn disease [facility])     Past Surgical History:  Procedure Laterality Date  . COLONOSCOPY      Family History  Problem Relation Age of Onset  . Mental illness Father   . Aneurysm Father   . Cancer Paternal Grandmother     Social History:  reports that she has been smoking Cigarettes.  She started smoking about 28 years ago. She has never used smokeless tobacco. She reports that she does not drink alcohol or use drugs. Remote hx of ETOH:  None x 17 years Tobacco - + smokeless tobacco Drugs:  None Single lives alone, but both parents are here with her from [place].    Allergies:  Allergies  Allergen Reactions  .  Other     Recovering alcoholic - prefers no narcotic's     Prior to Admission medications   Medication Sig Start Date End Date Taking? Authorizing Provider  ibuprofen (ADVIL,MOTRIN) 200 MG tablet Take 400 mg by mouth every 6 (six) hours as needed for fever, headache, mild pain, moderate pain or cramping.   Yes [provider]      Results for orders placed or performed during the hospital encounter of 08/26/17 (from the past 48 hour(s))  Lipase, blood     Status: None   Collection Time: 08/26/17  7:35 AM  Result Value Ref Range   Lipase 22 11 - 51 U/L  Comprehensive metabolic panel     Status: Abnormal   Collection Time: 08/26/17  7:35 AM  Result Value Ref Range   Sodium 137 135 - 145 mmol/L   Potassium 4.4 3.5 - 5.1 mmol/L   Chloride 105 101 - 111 mmol/L   CO2 20 (L) 22 - 32 mmol/L   Glucose, Bld 141 (H) 65 - 99 mg/dL   BUN 14 6 - 20 mg/dL   Creatinine, Ser 0.88 0.44 - 1.00 mg/dL   Calcium 8.9 8.9 - 10.3 mg/dL   Total Protein 7.2 6.5 - 8.1 g/dL   Albumin 4.2 3.5 - 5.0 g/dL   AST 23 15 - 41 U/L   ALT 12 (L) 14 - 54 U/L   Alkaline  Phosphatase 42 38 - 126 U/L   Total Bilirubin 1.1 0.3 - 1.2 mg/dL   GFR calc non Af Amer >60 >60 mL/min   GFR calc Af Amer >60 >60 mL/min    Comment: (NOTE) The eGFR has been calculated using the CKD EPI equation. This calculation has not been validated in all clinical situations. eGFR's persistently <60 mL/min signify possible Chronic Kidney Disease.    Anion gap 12 5 - 15  CBC     Status: Abnormal   Collection Time: 08/26/17  7:35 AM  Result Value Ref Range   WBC 20.8 (H) 4.0 - 10.5 K/uL   RBC 4.12 3.87 - 5.11 MIL/uL   Hemoglobin 13.1 12.0 - 15.0 g/dL   HCT 37.8 36.0 - 46.0 %   MCV 91.7 78.0 - 100.0 fL   MCH 31.8 26.0 - 34.0 pg   MCHC 34.7 30.0 - 36.0 g/dL   RDW 13.4 11.5 - 15.5 %   Platelets 146 (L) 150 - 400 K/uL  I-Stat beta hCG blood, ED     Status: None   Collection Time: 08/26/17  7:53 AM  Result Value Ref Range    I-stat hCG, quantitative <5.0 <5 mIU/mL   Comment 3            Comment:   GEST. AGE      CONC.  (mIU/mL)   <=1 WEEK        5 - 50     2 WEEKS       50 - 500     3 WEEKS       100 - 10,000     4 WEEKS     1,000 - 30,000        FEMALE AND NON-PREGNANT FEMALE:     LESS THAN 5 mIU/mL     Ct Abdomen Pelvis W Contrast  Result Date: 08/26/2017 CLINICAL DATA:  Right lower quadrant pain beginning yesterday. Nausea and vomiting. Diarrhea. Crohn's disease. EXAM: CT ABDOMEN AND PELVIS WITH CONTRAST TECHNIQUE: Multidetector CT imaging of the abdomen and pelvis was performed using the standard protocol following bolus administration of intravenous contrast. CONTRAST:  100 mL Isovue-300 COMPARISON:  None. FINDINGS: Lower Chest: No acute findings. Hepatobiliary: Several low-attenuation liver lesions are seen, largest in the central right hepatic lobe adjacent to the right portal vein measuring 1.6 cm. These have nonspecific features. Gallbladder is unremarkable. Pancreas:  No mass or inflammatory changes. Spleen: Within normal limits in size and appearance. Adrenals/Urinary Tract: No masses identified. No evidence of hydronephrosis. Stomach/Bowel: A distal small bowel obstruction is seen with transition point in the terminal ileum. Moderate wall thickening and mucosal enhancement is seen involving terminal ileum with adjacent inflammatory changes, consistent with Crohn's disease. In addition, there is a rim enhancing fluid collection in the right lower quadrant which contains stool and measures 9.3 by 5.3 x 4.6 cm. This is consistent with abscess. Vascular/Lymphatic: No pathologically enlarged lymph nodes. No abdominal aortic aneurysm. Reproductive: No mass or other significant abnormality. Tiny amount of free fluid noted in pelvis. Other:  None. Musculoskeletal:  No suspicious bone lesions identified. IMPRESSION: Distal small bowel obstruction due to moderate wall thickening and inflammatory changes involving the  terminal ileum, consistent with active Crohn's disease. 9 x 5 x 5 cm rim enhancing collection containing stool in right lower quadrant, consistent with abscess. Several indeterminate low-attenuation liver lesions, largest measuring 1.6 cm. Nonemergent abdomen MRI without and with contrast is recommended for further characterization. Electronically Signed   By:  Earle Gell M.D.   On: 08/26/2017 12:15    Review of Systems  Constitutional: Positive for chills. Negative for diaphoresis, fever, malaise/fatigue and weight loss.  HENT: Negative.   Eyes: Negative.   Respiratory: Negative.   Cardiovascular: Negative.   Gastrointestinal: Positive for abdominal pain, nausea and vomiting. Negative for blood in stool, constipation (she has large BM's), diarrhea and melena.       She has Crohn's flares, but it is often similar to bad menstrual periods, related to menstrual periods,or with certain foods.  Genitourinary: Negative.   Musculoskeletal: Negative.   Skin: Negative.   Neurological: Negative.  Negative for weakness.  Endo/Heme/Allergies: Negative.   Psychiatric/Behavioral: Negative.    Blood pressure 122/64, pulse 75, temperature 98 F (36.7 C), temperature source Oral, resp. rate 15, height _0  (1.803 m), weight 72.6 kg (160 lb), last menstrual period 07/29/2017, SpO2 99 %. Physical Exam  Constitutional: She is oriented to person, place, and time. She appears well-developed and well-nourished. No distress.  HENT:  Head: Normocephalic and atraumatic.  Mouth/Throat: No oropharyngeal exudate.  Eyes: Right eye exhibits no discharge. Left eye exhibits no discharge. No scleral icterus.  Pupils are equal  Neck: Normal range of motion. Neck supple. No JVD present. No tracheal deviation present. No thyromegaly present.  Cardiovascular: Normal rate, regular rhythm, normal heart sounds and intact distal pulses.   No murmur heard. Respiratory: Effort normal and breath sounds normal. No respiratory  distress. She has no wheezes. She has no rales. She exhibits no tenderness.  GI: Soft. She exhibits distension (minimal ) and mass (she feels a little more swollen on the right than the left.). There is tenderness (most severe on the right side). There is no rebound and no guarding.  Musculoskeletal: She exhibits no edema or tenderness.  Lymphadenopathy:    She has no cervical adenopathy.  Neurological: She is alert and oriented to person, place, and time. No cranial nerve deficit.  Skin: Skin is warm and dry. No rash noted. She is not diaphoretic. No erythema. No pallor.  Psychiatric: She has a normal mood and affect. Her behavior is normal. Judgment and thought content normal.    Assessment/Plan: Crohn's with ileocecal obstruction Hx of Crohn's disease, dormant and off medications for some years.   Remote hx of ETOH use Hx of tobacco use  Plan:  After review of the CT, talking with the patient and examining her it is Dr. Excell Seltzer opinion that this is not an abscess, but a significant obstruction secondary to her Crohn's disease.  He recommends admission and  Initial Medical management. This may not resolve the obstruction, but it would be a reasonable first step.   Dr. Earlean Shawl has been her primary GI doctor here.  She was seen at [facility] in [place], [place] for her original treatment at age 64.  Dr. Sammuel Cooper 08/26/2017, 12:29 PM

## 2017-08-26 NOTE — ED Triage Notes (Signed)
Pt states she started having RLQ pain yesterday about 5pm  Pt states the pain has been "relentless"  Pt has had nausea and vomiting with a little bit of diarrhea  Pt has hx of crohns disease

## 2017-08-27 ENCOUNTER — Inpatient Hospital Stay (HOSPITAL_COMMUNITY): Payer: Commercial Managed Care - PPO

## 2017-08-27 DIAGNOSIS — K50914 Crohn's disease, unspecified, with abscess: Secondary | ICD-10-CM

## 2017-08-27 LAB — HIV ANTIBODY (ROUTINE TESTING W REFLEX): HIV Screen 4th Generation wRfx: NONREACTIVE

## 2017-08-27 LAB — CBC
HEMATOCRIT: 33.2 % — AB (ref 36.0–46.0)
Hemoglobin: 11.6 g/dL — ABNORMAL LOW (ref 12.0–15.0)
MCH: 32.2 pg (ref 26.0–34.0)
MCHC: 34.9 g/dL (ref 30.0–36.0)
MCV: 92.2 fL (ref 78.0–100.0)
Platelets: 147 10*3/uL — ABNORMAL LOW (ref 150–400)
RBC: 3.6 MIL/uL — AB (ref 3.87–5.11)
RDW: 13.8 % (ref 11.5–15.5)
WBC: 11.6 10*3/uL — AB (ref 4.0–10.5)

## 2017-08-27 LAB — BASIC METABOLIC PANEL
Anion gap: 7 (ref 5–15)
BUN: 12 mg/dL (ref 6–20)
CHLORIDE: 108 mmol/L (ref 101–111)
CO2: 23 mmol/L (ref 22–32)
CREATININE: 0.61 mg/dL (ref 0.44–1.00)
Calcium: 8.1 mg/dL — ABNORMAL LOW (ref 8.9–10.3)
GFR calc non Af Amer: >60 mL/min (ref >60)
Glucose, Bld: 86 mg/dL (ref 65–99)
POTASSIUM: 3.9 mmol/L (ref 3.5–5.1)
SODIUM: 138 mmol/L (ref 135–145)

## 2017-08-27 LAB — PROTIME-INR
INR: 1.15
Prothrombin Time: 14.6 seconds (ref 11.4–15.2)

## 2017-08-27 NOTE — Progress Notes (Signed)
PROGRESS NOTE  Helen [name]  SPQ:[id number] DOB: 1971-07-28 DOA: 08/26/2017 PCP: Patient, No Pcp Per  Outpatient Specialists: GI, Medoff Brief Narrative: Helen [name] is a 46 y.o. female with a history of Crohn's disease and EtOH abuse who presented to the ED due to RLQ abdominal pain worsening, constant, severe over the past few days. She'd developed nausea and vomiting and stopped having BM's or flatus. Evaluation with CT abdomen showed distal small bowel obstruction with transition point within wall thickening and mucosal enhancement in the terminal ileum. Also noted was a rim-enhancing fluid collection in the right lower quadrant which contains stool and measures 9.3 x 5.3 x 4.6 cm consistent with an abscess.  Assessment & Plan: Principal Problem:   Bowel obstruction (HCC) Active Problems:   History of ETOH abuse  Crohn's flare with ileocecal obstruction:  - General surgery following and GI consulted.  - Repeat XR shows contrast into colon, though no flatus yet. Continuing NPO.  - Continue ceftriaxone, flagyl and IV steroids.  - Repeat XR in AM  History of alcohol abuse: None currently. No evidence of withdrawal.  - If negative scores over next 24 hours, would DC CIWA.   DVT prophylaxis: Lovenox Code Status: Full Family Communication: None at bedside Disposition Plan: Continue inpatient management  Consultants:   GI, Dr. Earlean Shawl  General surgery  Procedures:   None  Antimicrobials:  Ceftriaxone, flagyl 10/24 >>    Subjective: No flatus or BM, but abdominal pain is improving, not requiring analgesics. No nausea/vomiting with ice chips. No fevers or bleeding.   Objective: Vitals:   08/26/17 1715 08/26/17 2000 08/27/17 0500 08/27/17 0541  BP: 110/64 115/69  102/62  Pulse: 70 76  71  Resp: 16 18  16   Temp: 100 F (37.8 C) 98.7 F (37.1 C)  99.6 F (37.6 C)  TempSrc: Oral Oral  Oral  SpO2: 100% 100%  98%  Weight: 72.6 kg (160 lb)  72.6 kg (160 lb)   Height: 5'  11" (1.803 m)       Intake/Output Summary (Last 24 hours) at 08/27/17 1218 Last data filed at 08/27/17 0300  Gross per 24 hour  Intake             1205 ml  Output                1 ml  Net             1204 ml   Filed Weights   08/26/17 0656 08/26/17 1715 08/27/17 0500  Weight: 72.6 kg (160 lb) 72.6 kg (160 lb) 72.6 kg (160 lb)    Gen: 46 y.o. female in no distress  Pulm: Non-labored breathing room air. Clear to auscultation bilaterally.  CV: Regular rate and rhythm. No murmur, rub, or gallop. No JVD, no pedal edema. GI: Abdomen soft, mildly tender to palpation in right lower quadrant, non-distended, with normoactive bowel sounds. No organomegaly or masses felt. Ext: Warm, no deformities Skin: No rashes, lesions no ulcers Neuro: Alert and oriented. No focal neurological deficits. Psych: Judgement and insight appear normal. Mood & affect appropriate.   Data Reviewed: I have personally reviewed following labs and imaging studies  CBC:  Recent Labs Lab 08/26/17 0735 08/26/17 1613 08/27/17 0406  WBC 20.8* 15.0* 11.6*  HGB 13.1 11.7* 11.6*  HCT 37.8 33.7* 33.2*  MCV 91.7 91.8 92.2  PLT 146* 155 333*   Basic Metabolic Panel:  Recent Labs Lab 08/26/17 0735 08/26/17 1613 08/27/17 0406  NA 137  --  138  K 4.4  --  3.9  CL 105  --  108  CO2 20*  --  23  GLUCOSE 141*  --  86  BUN 14  --  12  CREATININE 0.88 0.73 0.61  CALCIUM 8.9  --  8.1*   GFR: Estimated Creatinine Clearance: 100.3 mL/min (by C-G formula based on SCr of 0.61 mg/dL). Liver Function Tests:  Recent Labs Lab 08/26/17 0735  AST 23  ALT 12*  ALKPHOS 42  BILITOT 1.1  PROT 7.2  ALBUMIN 4.2    Recent Labs Lab 08/26/17 0735  LIPASE 22   No results for input(s): AMMONIA in the last 168 hours. Coagulation Profile:  Recent Labs Lab 08/27/17 0406  INR 1.15   Cardiac Enzymes: No results for input(s): CKTOTAL, CKMB, CKMBINDEX, TROPONINI in the last 168 hours. BNP (last 3 results) No results  for input(s): PROBNP in the last 8760 hours. HbA1C: No results for input(s): HGBA1C in the last 72 hours. CBG: No results for input(s): GLUCAP in the last 168 hours. Lipid Profile: No results for input(s): CHOL, HDL, LDLCALC, TRIG, CHOLHDL, LDLDIRECT in the last 72 hours. Thyroid Function Tests: No results for input(s): TSH, T4TOTAL, FREET4, T3FREE, THYROIDAB in the last 72 hours. Anemia Panel: No results for input(s): VITAMINB12, FOLATE, FERRITIN, TIBC, IRON, RETICCTPCT in the last 72 hours. Urine analysis:    Component Value Date/Time   COLORURINE YELLOW 08/26/2017 1216   APPEARANCEUR CLEAR 08/26/2017 1216   LABSPEC >1.046 (H) 08/26/2017 1216   PHURINE 5.0 08/26/2017 1216   GLUCOSEU NEGATIVE 08/26/2017 1216   HGBUR SMALL (A) 08/26/2017 [place] 08/26/2017 1216   KETONESUR 20 (A) 08/26/2017 1216   PROTEINUR NEGATIVE 08/26/2017 1216   NITRITE NEGATIVE 08/26/2017 1216   LEUKOCYTESUR NEGATIVE 08/26/2017 1216   No results found for this or any previous visit (from the past 240 hour(s)).    Radiology Studies: Ct Abdomen Pelvis W Contrast  Addendum Date: 08/26/2017   ADDENDUM REPORT: 08/26/2017 14:55 ADDENDUM: On further review, the 9 cm rim enhancing collection containing stool in the right lower quadrant could represent fecalized material within the distal small bowel lumen, rather than an adjacent extraluminal abscess. Delayed CT images to allow passage of oral contrast material may be helpful for further evaluation of this finding. Electronically Signed   By: Earle Gell M.D.   On: 08/26/2017 14:55   Result Date: 08/26/2017 CLINICAL DATA:  Right lower quadrant pain beginning yesterday. Nausea and vomiting. Diarrhea. Crohn's disease. EXAM: CT ABDOMEN AND PELVIS WITH CONTRAST TECHNIQUE: Multidetector CT imaging of the abdomen and pelvis was performed using the standard protocol following bolus administration of intravenous contrast. CONTRAST:  100 mL Isovue-300  COMPARISON:  None. FINDINGS: Lower Chest: No acute findings. Hepatobiliary: Several low-attenuation liver lesions are seen, largest in the central right hepatic lobe adjacent to the right portal vein measuring 1.6 cm. These have nonspecific features. Gallbladder is unremarkable. Pancreas:  No mass or inflammatory changes. Spleen: Within normal limits in size and appearance. Adrenals/Urinary Tract: No masses identified. No evidence of hydronephrosis. Stomach/Bowel: A distal small bowel obstruction is seen with transition point in the terminal ileum. Moderate wall thickening and mucosal enhancement is seen involving terminal ileum with adjacent inflammatory changes, consistent with Crohn's disease. In addition, there is a rim enhancing fluid collection in the right lower quadrant which contains stool and measures 9.3 by 5.3 x 4.6 cm. This is consistent with abscess. Vascular/Lymphatic: No pathologically enlarged lymph nodes. No abdominal aortic  aneurysm. Reproductive: No mass or other significant abnormality. Tiny amount of free fluid noted in pelvis. Other:  None. Musculoskeletal:  No suspicious bone lesions identified. IMPRESSION: Distal small bowel obstruction due to moderate wall thickening and inflammatory changes involving the terminal ileum, consistent with active Crohn's disease. 9 x 5 x 5 cm rim enhancing collection containing stool in right lower quadrant, consistent with abscess. Several indeterminate low-attenuation liver lesions, largest measuring 1.6 cm. Nonemergent abdomen MRI without and with contrast is recommended for further characterization. Electronically Signed: By: Earle Gell M.D. On: 08/26/2017 12:15   Dg Abd 2 Views  Result Date: 08/27/2017 CLINICAL DATA:  Follow-up small bowel obstruction. EXAM: ABDOMEN - 2 VIEW COMPARISON:  CT scan 08/26/2017. FINDINGS: The bowel gas pattern appears improved, with less small bowel distention, when technique differences to CT scan are considered.  Enteric contrast can be seen within the colon. There appears to be less stool burden. IMPRESSION: Improving small bowel obstruction. Electronically Signed   By: Staci Righter M.D.   On: 08/27/2017 08:37    Scheduled Meds: . folic acid  1 mg Oral Daily  . heparin  5,000 Units Subcutaneous Q8H  . methylPREDNISolone (SOLU-MEDROL) injection  20 mg Intravenous Q6H  . multivitamin with minerals  1 tablet Oral Daily  . thiamine  100 mg Oral Daily   Or  . thiamine  100 mg Intravenous Daily   Continuous Infusions: . cefTRIAXone (ROCEPHIN)  IV Stopped (08/27/17 1056)   And  . metronidazole 500 mg (08/27/17 1140)     LOS: 1 day   Time spent: 25 minutes.  Vance Gather, MD [facility] Pager [phone]  If 7PM-7AM, please contact night-coverage [email or web address] Password [email or web address] 08/27/2017, 12:18 PM

## 2017-08-27 NOTE — Progress Notes (Signed)
[facility] Progress Note     Subjective: CC: SBO Patient states that she is still having some pain in the RLQ but it is improved from yesterday. Denies nausea or vomiting, actually feels hungry. No flatus.  VSS.   Objective: Vital signs in last 24 hours: Temp:  [98.7 F (37.1 C)-100 F (37.8 C)] 99.6 F (37.6 C) (10/25 0541) Pulse Rate:  [70-77] 71 (10/25 0541) Resp:  [15-18] 16 (10/25 0541) BP: (102-126)/(57-83) 102/62 (10/25 0541) SpO2:  [98 %-100 %] 98 % (10/25 0541) Weight:  [72.6 kg (160 lb)] 72.6 kg (160 lb) (10/25 0500)    Intake/Output from previous day: 10/24 0701 - 10/25 0700 In: 2355 [I.V.:1005; IV Piggyback:1350] Out: 1 [Urine:1] Intake/Output this shift: No intake/output data recorded.  PE: Gen:  Alert, NAD, pleasant Card:  Regular rate and rhythm, pedal pulses 2+ BL Pulm:  Normal effort Abd: Soft, moderately TTP in the RLQ, non-distended, bowel sounds present but hypoactive, no HSM Skin: warm and dry, no rashes  Psych: A&Ox3   Lab Results:   Recent Labs  08/26/17 1613 08/27/17 0406  WBC 15.0* 11.6*  HGB 11.7* 11.6*  HCT 33.7* 33.2*  PLT 155 147*   BMET  Recent Labs  08/26/17 0735 08/26/17 1613 08/27/17 0406  NA 137  --  138  K 4.4  --  3.9  CL 105  --  108  CO2 20*  --  23  GLUCOSE 141*  --  86  BUN 14  --  12  CREATININE 0.88 0.73 0.61  CALCIUM 8.9  --  8.1*   PT/INR  Recent Labs  08/27/17 0406  LABPROT 14.6  INR 1.15   CMP     Component Value Date/Time   NA 138 08/27/2017 0406   K 3.9 08/27/2017 0406   CL 108 08/27/2017 0406   CO2 23 08/27/2017 0406   GLUCOSE 86 08/27/2017 0406   BUN 12 08/27/2017 0406   CREATININE 0.61 08/27/2017 0406   CREATININE 0.67 12/08/2015 1018   CALCIUM 8.1 (L) 08/27/2017 0406   PROT 7.2 08/26/2017 0735   ALBUMIN 4.2 08/26/2017 0735   AST 23 08/26/2017 0735   ALT 12 (L) 08/26/2017 0735   ALKPHOS 42 08/26/2017 0735   BILITOT 1.1 08/26/2017 0735   GFRNONAA >60 08/27/2017  0406   GFRAA >60 08/27/2017 0406   Lipase     Component Value Date/Time   LIPASE 22 08/26/2017 0735       Studies/Results: Ct Abdomen Pelvis W Contrast  Addendum Date: 08/26/2017   ADDENDUM REPORT: 08/26/2017 14:55 ADDENDUM: On further review, the 9 cm rim enhancing collection containing stool in the right lower quadrant could represent fecalized material within the distal small bowel lumen, rather than an adjacent extraluminal abscess. Delayed CT images to allow passage of oral contrast material may be helpful for further evaluation of this finding. Electronically Signed   By: Earle Gell M.D.   On: 08/26/2017 14:55   Result Date: 08/26/2017 CLINICAL DATA:  Right lower quadrant pain beginning yesterday. Nausea and vomiting. Diarrhea. Crohn's disease. EXAM: CT ABDOMEN AND PELVIS WITH CONTRAST TECHNIQUE: Multidetector CT imaging of the abdomen and pelvis was performed using the standard protocol following bolus administration of intravenous contrast. CONTRAST:  100 mL Isovue-300 COMPARISON:  None. FINDINGS: Lower Chest: No acute findings. Hepatobiliary: Several low-attenuation liver lesions are seen, largest in the central right hepatic lobe adjacent to the right portal vein measuring 1.6 cm. These have nonspecific features. Gallbladder is unremarkable. Pancreas:  No mass or inflammatory changes. Spleen: Within normal limits in size and appearance. Adrenals/Urinary Tract: No masses identified. No evidence of hydronephrosis. Stomach/Bowel: A distal small bowel obstruction is seen with transition point in the terminal ileum. Moderate wall thickening and mucosal enhancement is seen involving terminal ileum with adjacent inflammatory changes, consistent with Crohn's disease. In addition, there is a rim enhancing fluid collection in the right lower quadrant which contains stool and measures 9.3 by 5.3 x 4.6 cm. This is consistent with abscess. Vascular/Lymphatic: No pathologically enlarged lymph nodes.  No abdominal aortic aneurysm. Reproductive: No mass or other significant abnormality. Tiny amount of free fluid noted in pelvis. Other:  None. Musculoskeletal:  No suspicious bone lesions identified. IMPRESSION: Distal small bowel obstruction due to moderate wall thickening and inflammatory changes involving the terminal ileum, consistent with active Crohn's disease. 9 x 5 x 5 cm rim enhancing collection containing stool in right lower quadrant, consistent with abscess. Several indeterminate low-attenuation liver lesions, largest measuring 1.6 cm. Nonemergent abdomen MRI without and with contrast is recommended for further characterization. Electronically Signed: By: Earle Gell M.D. On: 08/26/2017 12:15   Dg Abd 2 Views  Result Date: 08/27/2017 CLINICAL DATA:  Follow-up small bowel obstruction. EXAM: ABDOMEN - 2 VIEW COMPARISON:  CT scan 08/26/2017. FINDINGS: The bowel gas pattern appears improved, with less small bowel distention, when technique differences to CT scan are considered. Enteric contrast can be seen within the colon. There appears to be less stool burden. IMPRESSION: Improving small bowel obstruction. Electronically Signed   By: Staci Righter M.D.   On: 08/27/2017 08:37    Anti-infectives: Anti-infectives    Start     Dose/Rate Route Frequency Ordered Stop   08/27/17 0800  cefTRIAXone (ROCEPHIN) 2 g in dextrose 5 % 50 mL IVPB     2 g 100 mL/hr over 30 Minutes Intravenous Every 24 hours 08/26/17 1608     08/26/17 1800  metroNIDAZOLE (FLAGYL) IVPB 500 mg     500 mg 100 mL/hr over 60 Minutes Intravenous Every 8 hours 08/26/17 1608     08/26/17 0815  cefTRIAXone (ROCEPHIN) 2 g in dextrose 5 % 50 mL IVPB     2 g 100 mL/hr over 30 Minutes Intravenous  Once 08/26/17 0811 08/26/17 0907   08/26/17 0815  metroNIDAZOLE (FLAGYL) IVPB 500 mg     500 mg 100 mL/hr over 60 Minutes Intravenous  Once 08/26/17 0811 08/26/17 1017       Assessment/Plan Hx of EtOH use Hx of tobacco use   Hx  of Crohn's Disease - had been dormant and patient was off medications for ~15 yrs - patient's GI MD Dr. Earlean Shawl consulted yesterday Crohn's flare with ileocecal obstruction - CT shows dilated fecalized segment of terminal ileum at the ileocecal valve - XR this AM shows improving SBO with contrast in the colon - repeat XR tomorrow AM - continue bowel rest and steroids - mobilize as tolerated - patient may ultimately end up needing surgery for possible stricture  FEN: NPO with ice chips, IVF VTE: SCDs, heparin ID: rocephin/flagyl (10/24>>), WBC trending down, afebrile   Plan: GI consult pending. Continue current management. Will continue to follow.   LOS: 1 day    Brigid Re , [facility] 08/27/2017, 9:37 AM Pager: [phone] Consults: [phone] Mon-Fri 7:00 am-4:30 pm Sat-Sun 7:00 am-11:30 am

## 2017-08-28 ENCOUNTER — Inpatient Hospital Stay (HOSPITAL_COMMUNITY): Payer: Commercial Managed Care - PPO

## 2017-08-28 MED ORDER — DOCUSATE SODIUM 100 MG PO CAPS
100.0000 mg | ORAL_CAPSULE | Freq: Two times a day (BID) | ORAL | Status: DC
  Administered 2017-08-28 – 2017-08-30 (×5): 100 mg via ORAL
  Filled 2017-08-28 (×5): qty 1

## 2017-08-28 MED ORDER — POLYETHYLENE GLYCOL 3350 17 G PO PACK
17.0000 g | PACK | Freq: Every day | ORAL | Status: DC
  Administered 2017-08-28 – 2017-08-30 (×3): 17 g via ORAL
  Filled 2017-08-28 (×3): qty 1

## 2017-08-28 NOTE — Progress Notes (Signed)
Mellisa walks around the unit several times a day

## 2017-08-28 NOTE — Progress Notes (Signed)
PROGRESS NOTE  Helen [name]  OJJ:[id number] DOB: 02-07-1971 DOA: 08/26/2017 PCP: Patient, No Pcp Per  Outpatient Specialists: GI, Medoff Brief Narrative: Helen [name] is a 46 y.o. female with a history of Crohn's disease and EtOH abuse who presented to the ED due to RLQ abdominal pain worsening, constant, severe over the past few days. She'd developed nausea and vomiting and stopped having BM's or flatus. Evaluation with CT abdomen showed distal small bowel obstruction with transition point within wall thickening and mucosal enhancement in the terminal ileum. Also noted was a rim-enhancing fluid collection in the right lower quadrant which contains stool and measures 9.3 x 5.3 x 4.6 cm consistent with an abscess.  Assessment & Plan: Principal Problem:   Bowel obstruction (HCC) Active Problems:   History of ETOH abuse   Crohn's flare with ileocecal obstruction -General surgery following and GI consulted. Dr. Earlean Shawl called her room, will see her as an outpatient -passing gas today, start clears per surgery -Continue ceftriaxone, flagyl and IV steroids.   History of alcohol abuse -None currently. No evidence of withdrawal.  Discontinue CIWA   DVT prophylaxis: Lovenox Code Status: Full Family Communication: None at bedside Disposition Plan: Continue inpatient management  Consultants:   GI, Dr. Earlean Shawl  General surgery  Procedures:   None  Antimicrobials:  Ceftriaxone, flagyl 10/24 >>    Subjective: -Feeling a lot better today, abdominal pain is almost resolved.  No chest pain or shortness of breath.  No fevers or chills.  Objective: Vitals:   08/27/17 1200 08/27/17 1528 08/27/17 2104 08/28/17 0524  BP: 112/66 (!) 105/58 (!) 107/57 (!) 106/57  Pulse: 73 65 73 60  Resp:  17 18 18   Temp:  98.5 F (36.9 C) 98.7 F (37.1 C) 99.2 F (37.3 C)  TempSrc:  Oral Oral Oral  SpO2:  98% 98% 97%  Weight:      Height:        Intake/Output Summary (Last 24 hours) at 08/28/17  1018 Last data filed at 08/27/17 2105  Gross per 24 hour  Intake                0 ml  Output                1 ml  Net               -1 ml   Filed Weights   08/26/17 0656 08/26/17 1715 08/27/17 0500  Weight: 72.6 kg (160 lb) 72.6 kg (160 lb) 72.6 kg (160 lb)    Gen: NAD Pulm: CTA  CV: RRR  Data Reviewed: I have personally reviewed following labs and imaging studies  CBC:  Recent Labs Lab 08/26/17 0735 08/26/17 1613 08/27/17 0406  WBC 20.8* 15.0* 11.6*  HGB 13.1 11.7* 11.6*  HCT 37.8 33.7* 33.2*  MCV 91.7 91.8 92.2  PLT 146* 155 937*   Basic Metabolic Panel:  Recent Labs Lab 08/26/17 0735 08/26/17 1613 08/27/17 0406  NA 137  --  138  K 4.4  --  3.9  CL 105  --  108  CO2 20*  --  23  GLUCOSE 141*  --  86  BUN 14  --  12  CREATININE 0.88 0.73 0.61  CALCIUM 8.9  --  8.1*   GFR: Estimated Creatinine Clearance: 100.3 mL/min (by C-G formula based on SCr of 0.61 mg/dL). Liver Function Tests:  Recent Labs Lab 08/26/17 0735  AST 23  ALT 12*  ALKPHOS 42  BILITOT 1.1  PROT 7.2  ALBUMIN 4.2    Recent Labs Lab 08/26/17 0735  LIPASE 22   No results for input(s): AMMONIA in the last 168 hours. Coagulation Profile:  Recent Labs Lab 08/27/17 0406  INR 1.15   Cardiac Enzymes: No results for input(s): CKTOTAL, CKMB, CKMBINDEX, TROPONINI in the last 168 hours. BNP (last 3 results) No results for input(s): PROBNP in the last 8760 hours. HbA1C: No results for input(s): HGBA1C in the last 72 hours. CBG: No results for input(s): GLUCAP in the last 168 hours. Lipid Profile: No results for input(s): CHOL, HDL, LDLCALC, TRIG, CHOLHDL, LDLDIRECT in the last 72 hours. Thyroid Function Tests: No results for input(s): TSH, T4TOTAL, FREET4, T3FREE, THYROIDAB in the last 72 hours. Anemia Panel: No results for input(s): VITAMINB12, FOLATE, FERRITIN, TIBC, IRON, RETICCTPCT in the last 72 hours. Urine analysis:    Component Value Date/Time   COLORURINE YELLOW  08/26/2017 1216   APPEARANCEUR CLEAR 08/26/2017 1216   LABSPEC >1.046 (H) 08/26/2017 1216   PHURINE 5.0 08/26/2017 1216   GLUCOSEU NEGATIVE 08/26/2017 1216   HGBUR SMALL (A) 08/26/2017 [place] 08/26/2017 1216   KETONESUR 20 (A) 08/26/2017 1216   PROTEINUR NEGATIVE 08/26/2017 1216   NITRITE NEGATIVE 08/26/2017 1216   LEUKOCYTESUR NEGATIVE 08/26/2017 1216   No results found for this or any previous visit (from the past 240 hour(s)).    Radiology Studies: Ct Abdomen Pelvis W Contrast  Addendum Date: 08/26/2017   ADDENDUM REPORT: 08/26/2017 14:55 ADDENDUM: On further review, the 9 cm rim enhancing collection containing stool in the right lower quadrant could represent fecalized material within the distal small bowel lumen, rather than an adjacent extraluminal abscess. Delayed CT images to allow passage of oral contrast material may be helpful for further evaluation of this finding. Electronically Signed   By: Earle Gell M.D.   On: 08/26/2017 14:55   Result Date: 08/26/2017 CLINICAL DATA:  Right lower quadrant pain beginning yesterday. Nausea and vomiting. Diarrhea. Crohn's disease. EXAM: CT ABDOMEN AND PELVIS WITH CONTRAST TECHNIQUE: Multidetector CT imaging of the abdomen and pelvis was performed using the standard protocol following bolus administration of intravenous contrast. CONTRAST:  100 mL Isovue-300 COMPARISON:  None. FINDINGS: Lower Chest: No acute findings. Hepatobiliary: Several low-attenuation liver lesions are seen, largest in the central right hepatic lobe adjacent to the right portal vein measuring 1.6 cm. These have nonspecific features. Gallbladder is unremarkable. Pancreas:  No mass or inflammatory changes. Spleen: Within normal limits in size and appearance. Adrenals/Urinary Tract: No masses identified. No evidence of hydronephrosis. Stomach/Bowel: A distal small bowel obstruction is seen with transition point in the terminal ileum. Moderate wall thickening  and mucosal enhancement is seen involving terminal ileum with adjacent inflammatory changes, consistent with Crohn's disease. In addition, there is a rim enhancing fluid collection in the right lower quadrant which contains stool and measures 9.3 by 5.3 x 4.6 cm. This is consistent with abscess. Vascular/Lymphatic: No pathologically enlarged lymph nodes. No abdominal aortic aneurysm. Reproductive: No mass or other significant abnormality. Tiny amount of free fluid noted in pelvis. Other:  None. Musculoskeletal:  No suspicious bone lesions identified. IMPRESSION: Distal small bowel obstruction due to moderate wall thickening and inflammatory changes involving the terminal ileum, consistent with active Crohn's disease. 9 x 5 x 5 cm rim enhancing collection containing stool in right lower quadrant, consistent with abscess. Several indeterminate low-attenuation liver lesions, largest measuring 1.6 cm. Nonemergent abdomen MRI without and with contrast is recommended for further characterization.  Electronically Signed: By: Earle Gell M.D. On: 08/26/2017 12:15   Dg Abd 2 Views  Result Date: 08/27/2017 CLINICAL DATA:  Follow-up small bowel obstruction. EXAM: ABDOMEN - 2 VIEW COMPARISON:  CT scan 08/26/2017. FINDINGS: The bowel gas pattern appears improved, with less small bowel distention, when technique differences to CT scan are considered. Enteric contrast can be seen within the colon. There appears to be less stool burden. IMPRESSION: Improving small bowel obstruction. Electronically Signed   By: Staci Righter M.D.   On: 08/27/2017 08:37   Dg Abd Portable 1v  Result Date: 08/28/2017 CLINICAL DATA:  Small-bowel obstruction . EXAM: PORTABLE ABDOMEN - 1 VIEW COMPARISON:  08/27/2017 . FINDINGS: Soft tissue structures are unremarkable. No bowel distention. No free air. Small calcifications noted over the left kidney. Left nephrolithiasis cannot be excluded . No acute bony abnormality . IMPRESSION: 1.  No acute  abnormality identified. 2. Small calcifications noted over the left kidney. Left nephrolithiasis cannot be excluded Electronically Signed   By: [place]   On: 08/28/2017 06:49    Scheduled Meds: . docusate sodium  100 mg Oral BID  . folic acid  1 mg Oral Daily  . heparin  5,000 Units Subcutaneous Q8H  . methylPREDNISolone (SOLU-MEDROL) injection  20 mg Intravenous Q6H  . multivitamin with minerals  1 tablet Oral Daily  . polyethylene glycol  17 g Oral Daily  . thiamine  100 mg Oral Daily   Or  . thiamine  100 mg Intravenous Daily   Continuous Infusions: . cefTRIAXone (ROCEPHIN)  IV Stopped (08/28/17 0831)   And  . metronidazole 500 mg (08/28/17 0958)     LOS: 2 days   Jayke Caul M. Cruzita Lederer, MD [facility] [phone]  If 7PM-7AM, please contact night-coverage [email or web address] Password [email or web address]   08/28/2017, 10:18 AM

## 2017-08-28 NOTE — Care Management Note (Signed)
Case Management Note  Patient Details  Name: Helen [name] MRN: [id number] Date of Birth: 1971/07/26  Subjective/Objective:      46 yo admitted with bowel obstruction. Hx of Crohn's               Action/Plan: From home.  Expected Discharge Date:   (unknown)               Expected Discharge Plan:  Home/Self Care  In-House Referral:     Discharge planning Services  CM Consult  Post Acute Care Choice:    Choice offered to:     DME Arranged:    DME Agency:     HH Arranged:    HH Agency:     Status of Service:  In process, will continue to follow  If discussed at Long Length of Stay Meetings, dates discussed:    Additional CommentsLynnell Catalan, RN 08/28/2017, 11:18 AM  [phone]

## 2017-08-28 NOTE — Progress Notes (Signed)
[facility] Progress Note     Subjective: CC: SBO Patient reports abdominal pain is greatly improved. Denies n/v. Passing flatus, no BM. Spoke with Dr. Earlean Shawl over the phone last night, he told her to continue current management and follow up with him at discharge from the hospital. UOP good. VSS.   Objective: Vital signs in last 24 hours: Temp:  [98.5 F (36.9 C)-99.2 F (37.3 C)] 99.2 F (37.3 C) (10/26 0524) Pulse Rate:  [60-73] 60 (10/26 0524) Resp:  [17-18] 18 (10/26 0524) BP: (105-112)/(57-66) 106/57 (10/26 0524) SpO2:  [97 %-98 %] 97 % (10/26 0524)    Intake/Output from previous day: 10/25 0701 - 10/26 0700 In: -  Out: 1 [Urine:1] Intake/Output this shift: No intake/output data recorded.  PE: Gen:  Alert, NAD, pleasant Card:  Regular rate and rhythm, pedal pulses 2+ BL Pulm:  Normal effort, clear to auscultation bilaterally Abd: Soft, non-tender, non-distended, bowel sounds hypoactive, no HSM Skin: warm and dry, no rashes  Psych: A&Ox3   Lab Results:   Recent Labs  08/26/17 1613 08/27/17 0406  WBC 15.0* 11.6*  HGB 11.7* 11.6*  HCT 33.7* 33.2*  PLT 155 147*   BMET  Recent Labs  08/26/17 0735 08/26/17 1613 08/27/17 0406  NA 137  --  138  K 4.4  --  3.9  CL 105  --  108  CO2 20*  --  23  GLUCOSE 141*  --  86  BUN 14  --  12  CREATININE 0.88 0.73 0.61  CALCIUM 8.9  --  8.1*   PT/INR  Recent Labs  08/27/17 0406  LABPROT 14.6  INR 1.15   CMP     Component Value Date/Time   NA 138 08/27/2017 0406   K 3.9 08/27/2017 0406   CL 108 08/27/2017 0406   CO2 23 08/27/2017 0406   GLUCOSE 86 08/27/2017 0406   BUN 12 08/27/2017 0406   CREATININE 0.61 08/27/2017 0406   CREATININE 0.67 12/08/2015 1018   CALCIUM 8.1 (L) 08/27/2017 0406   PROT 7.2 08/26/2017 0735   ALBUMIN 4.2 08/26/2017 0735   AST 23 08/26/2017 0735   ALT 12 (L) 08/26/2017 0735   ALKPHOS 42 08/26/2017 0735   BILITOT 1.1 08/26/2017 0735   GFRNONAA >60 08/27/2017  0406   GFRAA >60 08/27/2017 0406   Lipase     Component Value Date/Time   LIPASE 22 08/26/2017 0735       Studies/Results: Ct Abdomen Pelvis W Contrast  Addendum Date: 08/26/2017   ADDENDUM REPORT: 08/26/2017 14:55 ADDENDUM: On further review, the 9 cm rim enhancing collection containing stool in the right lower quadrant could represent fecalized material within the distal small bowel lumen, rather than an adjacent extraluminal abscess. Delayed CT images to allow passage of oral contrast material may be helpful for further evaluation of this finding. Electronically Signed   By: Earle Gell M.D.   On: 08/26/2017 14:55   Result Date: 08/26/2017 CLINICAL DATA:  Right lower quadrant pain beginning yesterday. Nausea and vomiting. Diarrhea. Crohn's disease. EXAM: CT ABDOMEN AND PELVIS WITH CONTRAST TECHNIQUE: Multidetector CT imaging of the abdomen and pelvis was performed using the standard protocol following bolus administration of intravenous contrast. CONTRAST:  100 mL Isovue-300 COMPARISON:  None. FINDINGS: Lower Chest: No acute findings. Hepatobiliary: Several low-attenuation liver lesions are seen, largest in the central right hepatic lobe adjacent to the right portal vein measuring 1.6 cm. These have nonspecific features. Gallbladder is unremarkable. Pancreas:  No mass or  inflammatory changes. Spleen: Within normal limits in size and appearance. Adrenals/Urinary Tract: No masses identified. No evidence of hydronephrosis. Stomach/Bowel: A distal small bowel obstruction is seen with transition point in the terminal ileum. Moderate wall thickening and mucosal enhancement is seen involving terminal ileum with adjacent inflammatory changes, consistent with Crohn's disease. In addition, there is a rim enhancing fluid collection in the right lower quadrant which contains stool and measures 9.3 by 5.3 x 4.6 cm. This is consistent with abscess. Vascular/Lymphatic: No pathologically enlarged lymph nodes.  No abdominal aortic aneurysm. Reproductive: No mass or other significant abnormality. Tiny amount of free fluid noted in pelvis. Other:  None. Musculoskeletal:  No suspicious bone lesions identified. IMPRESSION: Distal small bowel obstruction due to moderate wall thickening and inflammatory changes involving the terminal ileum, consistent with active Crohn's disease. 9 x 5 x 5 cm rim enhancing collection containing stool in right lower quadrant, consistent with abscess. Several indeterminate low-attenuation liver lesions, largest measuring 1.6 cm. Nonemergent abdomen MRI without and with contrast is recommended for further characterization. Electronically Signed: By: Earle Gell M.D. On: 08/26/2017 12:15   Dg Abd 2 Views  Result Date: 08/27/2017 CLINICAL DATA:  Follow-up small bowel obstruction. EXAM: ABDOMEN - 2 VIEW COMPARISON:  CT scan 08/26/2017. FINDINGS: The bowel gas pattern appears improved, with less small bowel distention, when technique differences to CT scan are considered. Enteric contrast can be seen within the colon. There appears to be less stool burden. IMPRESSION: Improving small bowel obstruction. Electronically Signed   By: Staci Righter M.D.   On: 08/27/2017 08:37   Dg Abd Portable 1v  Result Date: 08/28/2017 CLINICAL DATA:  Small-bowel obstruction . EXAM: PORTABLE ABDOMEN - 1 VIEW COMPARISON:  08/27/2017 . FINDINGS: Soft tissue structures are unremarkable. No bowel distention. No free air. Small calcifications noted over the left kidney. Left nephrolithiasis cannot be excluded . No acute bony abnormality . IMPRESSION: 1.  No acute abnormality identified. 2. Small calcifications noted over the left kidney. Left nephrolithiasis cannot be excluded Electronically Signed   By: Marcello Moores  Register   On: 08/28/2017 06:49    Anti-infectives: Anti-infectives    Start     Dose/Rate Route Frequency Ordered Stop   08/27/17 0800  cefTRIAXone (ROCEPHIN) 2 g in dextrose 5 % 50 mL IVPB     2  g 100 mL/hr over 30 Minutes Intravenous Every 24 hours 08/26/17 1608     08/26/17 1800  metroNIDAZOLE (FLAGYL) IVPB 500 mg     500 mg 100 mL/hr over 60 Minutes Intravenous Every 8 hours 08/26/17 1608     08/26/17 0815  cefTRIAXone (ROCEPHIN) 2 g in dextrose 5 % 50 mL IVPB     2 g 100 mL/hr over 30 Minutes Intravenous  Once 08/26/17 0811 08/26/17 0907   08/26/17 0815  metroNIDAZOLE (FLAGYL) IVPB 500 mg     500 mg 100 mL/hr over 60 Minutes Intravenous  Once 08/26/17 0811 08/26/17 1017       Assessment/Plan Hx of EtOH use Hx of tobacco use   Hx of Crohn's Disease - had been dormant and patient was off medications for ~15 yrs - patient's GI MD Dr. Earlean Shawl consulted 10/24 - patient spoke with him on the phone last night Crohn's flare with ileocecal obstruction - CT shows dilated fecalized segment of terminal ileum at the ileocecal valve - XR this AM shows no abnormality - continue steroids and IV abx - mobilize as tolerated - patient may ultimately end up needing surgery for  possible stricture, can f/u in our office after GI f/u - advance diet to CLD  FEN: CLD VTE: SCDs, heparin ID: rocephin/flagyl (10/24>>), WBC trending down, afebrile   Plan: Advance to CLD. Continue abx and steroids. Mobilize as tolerated  LOS: 2 days    Brigid Re , [facility] 08/28/2017, 8:36 AM Pager: [phone] Consults: [phone] Mon-Fri 7:00 am-4:30 pm Sat-Sun 7:00 am-11:30 am

## 2017-08-29 ENCOUNTER — Inpatient Hospital Stay (HOSPITAL_COMMUNITY): Payer: Commercial Managed Care - PPO

## 2017-08-29 DIAGNOSIS — R110 Nausea: Secondary | ICD-10-CM

## 2017-08-29 LAB — CBC
HCT: 32.4 % — ABNORMAL LOW (ref 36.0–46.0)
Hemoglobin: 11 g/dL — ABNORMAL LOW (ref 12.0–15.0)
MCH: 31.3 pg (ref 26.0–34.0)
MCHC: 34 g/dL (ref 30.0–36.0)
MCV: 92 fL (ref 78.0–100.0)
PLATELETS: 179 10*3/uL (ref 150–400)
RBC: 3.52 MIL/uL — AB (ref 3.87–5.11)
RDW: 13.4 % (ref 11.5–15.5)
WBC: 13.8 10*3/uL — ABNORMAL HIGH (ref 4.0–10.5)

## 2017-08-29 LAB — MAGNESIUM: Magnesium: 1.8 mg/dL (ref 1.7–2.4)

## 2017-08-29 LAB — BASIC METABOLIC PANEL
Anion gap: 7 (ref 5–15)
BUN: 14 mg/dL (ref 6–20)
CHLORIDE: 105 mmol/L (ref 101–111)
CO2: 24 mmol/L (ref 22–32)
Calcium: 8.5 mg/dL — ABNORMAL LOW (ref 8.9–10.3)
Creatinine, Ser: 0.68 mg/dL (ref 0.44–1.00)
GFR calc Af Amer: >60 mL/min (ref >60)
GLUCOSE: 128 mg/dL — AB (ref 65–99)
POTASSIUM: 4 mmol/L (ref 3.5–5.1)
Sodium: 136 mmol/L (ref 135–145)

## 2017-08-29 NOTE — Progress Notes (Signed)
PROGRESS NOTE  Helen [name]  QKM:[id number] DOB: 1971/05/10 DOA: 08/26/2017 PCP: Patient, No Pcp Per  Outpatient Specialists: GI, [facility] Brief Narrative: Helen [name] is a 46 y.o. female with a history of Crohn's disease and EtOH abuse who presented to the ED due to RLQ abdominal pain worsening, constant, severe over the past few days. She'd developed nausea and vomiting and stopped having BM's or flatus. Evaluation with CT abdomen showed distal small bowel obstruction with transition point within wall thickening and mucosal enhancement in the terminal ileum. Also noted was a rim-enhancing fluid collection in the right lower quadrant which contains stool and measures 9.3 x 5.3 x 4.6 cm consistent with an abscess.   Subjective. Patient in bed, appears comfortable, denies any headache, no fever, no chest pain or pressure, no shortness of breath , no abdominal pain. No focal weakness.   Assessment & Plan:   Crohn's flare with ileocecal obstruction - CT shows dilated fecalized segment of terminal ileum at the ileocecal valve, Clinically as per GI and general surgery, GI Dr. Cyndee Brightly following remotely, general surgery on board, continue clear liquids and living with IV Steroids, Rocephin and Flagyl.clinically better, defer management of this problem to general surgery, she might require resection of the stenosed gut.    Remote history of alcohol abuse  - None currently. No evidence of withdrawal.  Discontinue CIWA   DVT prophylaxis: Lovenox Code Status: Full Family Communication: None at bedside Disposition Plan: Continue inpatient management  Consultants:   GI, Dr. Earlean Shawl  General surgery  Procedures:   None  Antimicrobials:  Ceftriaxone, flagyl 10/24 >>      Objective: Vitals:   08/28/17 0524 08/28/17 1553 08/28/17 2302 08/29/17 0625  BP: (!) 106/57 104/64 (!) 110/55 103/60  Pulse: 60 (!) 59 70 (!) 55  Resp: 18 18 16 16   Temp: 99.2 F (37.3 C) 98.4 F (36.9 C) 98.9  F (37.2 C) 98.7 F (37.1 C)  TempSrc: Oral Oral Oral Oral  SpO2: 97% 99% 100% 98%  Weight:    72.2 kg (159 lb 3.2 oz)  Height:        Intake/Output Summary (Last 24 hours) at 08/29/17 1136 Last data filed at 08/28/17 1700  Gross per 24 hour  Intake              500 ml  Output                0 ml  Net              500 ml   Filed Weights   08/26/17 1715 08/27/17 0500 08/29/17 0625  Weight: 72.6 kg (160 lb) 72.6 kg (160 lb) 72.2 kg (159 lb 3.2 oz)    Gen: NAD Pulm: CTA  CV: RRR  Data Reviewed: I have personally reviewed following labs and imaging studies  CBC:  Recent Labs Lab 08/26/17 0735 08/26/17 1613 08/27/17 0406 08/29/17 0346  WBC 20.8* 15.0* 11.6* 13.8*  HGB 13.1 11.7* 11.6* 11.0*  HCT 37.8 33.7* 33.2* 32.4*  MCV 91.7 91.8 92.2 92.0  PLT 146* 155 147* 579   Basic Metabolic Panel:  Recent Labs Lab 08/26/17 0735 08/26/17 1613 08/27/17 0406 08/29/17 0802  NA 137  --  138 136  K 4.4  --  3.9 4.0  CL 105  --  108 105  CO2 20*  --  23 24  GLUCOSE 141*  --  86 128*  BUN 14  --  12 14  CREATININE 0.88 0.73  0.61 0.68  CALCIUM 8.9  --  8.1* 8.5*  MG  --   --   --  1.8   GFR: Estimated Creatinine Clearance: 100.3 mL/min (by C-G formula based on SCr of 0.68 mg/dL). Liver Function Tests:  Recent Labs Lab 08/26/17 0735  AST 23  ALT 12*  ALKPHOS 42  BILITOT 1.1  PROT 7.2  ALBUMIN 4.2    Recent Labs Lab 08/26/17 0735  LIPASE 22   No results for input(s): AMMONIA in the last 168 hours. Coagulation Profile:  Recent Labs Lab 08/27/17 0406  INR 1.15   Cardiac Enzymes: No results for input(s): CKTOTAL, CKMB, CKMBINDEX, TROPONINI in the last 168 hours. BNP (last 3 results) No results for input(s): PROBNP in the last 8760 hours. HbA1C: No results for input(s): HGBA1C in the last 72 hours. CBG: No results for input(s): GLUCAP in the last 168 hours. Lipid Profile: No results for input(s): CHOL, HDL, LDLCALC, TRIG, CHOLHDL, LDLDIRECT in the  last 72 hours. Thyroid Function Tests: No results for input(s): TSH, T4TOTAL, FREET4, T3FREE, THYROIDAB in the last 72 hours. Anemia Panel: No results for input(s): VITAMINB12, FOLATE, FERRITIN, TIBC, IRON, RETICCTPCT in the last 72 hours. Urine analysis:    Component Value Date/Time   COLORURINE YELLOW 08/26/2017 1216   APPEARANCEUR CLEAR 08/26/2017 1216   LABSPEC >1.046 (H) 08/26/2017 1216   PHURINE 5.0 08/26/2017 1216   GLUCOSEU NEGATIVE 08/26/2017 1216   HGBUR SMALL (A) 08/26/2017 [place] 08/26/2017 1216   KETONESUR 20 (A) 08/26/2017 1216   PROTEINUR NEGATIVE 08/26/2017 1216   NITRITE NEGATIVE 08/26/2017 1216   LEUKOCYTESUR NEGATIVE 08/26/2017 1216   No results found for this or any previous visit (from the past 240 hour(s)).    Radiology Studies: Dg Abd 2 Views  Result Date: 08/29/2017 CLINICAL DATA:  Nausea.  History of Crohn's disease. EXAM: ABDOMEN - 2 VIEW COMPARISON:  Plain film of the abdomen dated 08/28/2017. CT abdomen dated 08/26/2017. FINDINGS: Mildly distended gas-filled loops of small and large bowel within the central abdomen, with associated air-fluid levels, suggesting continued partial obstruction and/or ileus. No evidence of soft tissue mass. No evidence of free intraperitoneal air. Lung bases appear clear. IMPRESSION: Findings compatible with persistent partial obstruction of the distal small bowel and probable colonic ileus, compatible with the distal small bowel obstruction described on earlier CT abdomen of 08/26/2017. Electronically Signed   By: Franki Cabot M.D.   On: 08/29/2017 11:06   Dg Abd Portable 1v  Result Date: 08/28/2017 CLINICAL DATA:  Small-bowel obstruction . EXAM: PORTABLE ABDOMEN - 1 VIEW COMPARISON:  08/27/2017 . FINDINGS: Soft tissue structures are unremarkable. No bowel distention. No free air. Small calcifications noted over the left kidney. Left nephrolithiasis cannot be excluded . No acute bony abnormality .  IMPRESSION: 1.  No acute abnormality identified. 2. Small calcifications noted over the left kidney. Left nephrolithiasis cannot be excluded Electronically Signed   By: [place]   On: 08/28/2017 06:49    Scheduled Meds: . docusate sodium  100 mg Oral BID  . folic acid  1 mg Oral Daily  . heparin  5,000 Units Subcutaneous Q8H  . methylPREDNISolone (SOLU-MEDROL) injection  20 mg Intravenous Q6H  . multivitamin with minerals  1 tablet Oral Daily  . polyethylene glycol  17 g Oral Daily  . thiamine  100 mg Oral Daily   Or  . thiamine  100 mg Intravenous Daily   Continuous Infusions: . cefTRIAXone (ROCEPHIN)  IV Stopped (  08/29/17 0845)   And  . metronidazole 500 mg (08/29/17 1046)     LOS: 3 days   Signature  Lala Lund M.D on 08/29/2017 at 11:36 AM  Between 7am to 7pm - Pager - [phone] ( page via [email or web address], text pages only, please mention full 10 digit call back number).  After 7pm go to [email or web address] - password [facility]

## 2017-08-29 NOTE — Progress Notes (Signed)
General Surgery [facility]  Assessment & Plan: Hx of Crohn's Disease- had been dormant and patient was off medications for ~15 yrs  - patient's GI MD Dr. Earlean Shawl Crohn's flare with ileocecal obstruction  - CT shows dilated fecalized segment of terminal ileum at the ileocecal valve  - continue steroids and IV abx  - advance diet as tolerated  Will follow with you.        Earnstine Regal, MD, [facility]       Office: [phone]    Chief Complaint: SBO due to Crohn's  Subjective: Patient comfortable in bed, ambulatory.  Denies pain.  Passing flatus, no real BM yet.  Objective: Vital signs in last 24 hours: Temp:  [98.4 F (36.9 C)-98.9 F (37.2 C)] 98.7 F (37.1 C) (10/27 0625) Pulse Rate:  [55-70] 55 (10/27 0625) Resp:  [16-18] 16 (10/27 0625) BP: (103-110)/(55-64) 103/60 (10/27 0625) SpO2:  [98 %-100 %] 98 % (10/27 0625) Weight:  [72.2 kg (159 lb 3.2 oz)] 72.2 kg (159 lb 3.2 oz) (10/27 0625) Last BM Date: 08/25/17  Intake/Output from previous day: 10/26 0701 - 10/27 0700 In: 700 [P.O.:700] Out: -  Intake/Output this shift: No intake/output data recorded.  Physical Exam: HEENT - sclerae clear, mucous membranes moist Neck - soft Chest - clear bilaterally Cor - RRR Abdomen - soft without distension; non-tender; no mass; active BS present Ext - no edema, non-tender Neuro - alert & oriented, no focal deficits  Lab Results:   Recent Labs  08/27/17 0406 08/29/17 0346  WBC 11.6* 13.8*  HGB 11.6* 11.0*  HCT 33.2* 32.4*  PLT 147* 179   BMET  Recent Labs  08/27/17 0406 08/29/17 0802  NA 138 136  K 3.9 4.0  CL 108 105  CO2 23 24  GLUCOSE 86 128*  BUN 12 14  CREATININE 0.61 0.68  CALCIUM 8.1* 8.5*   PT/INR  Recent Labs  08/27/17 0406  LABPROT 14.6  INR 1.15   Comprehensive Metabolic Panel:    Component Value Date/Time   NA 136 08/29/2017 0802   NA 138 08/27/2017 0406   K 4.0  08/29/2017 0802   K 3.9 08/27/2017 0406   CL 105 08/29/2017 0802   CL 108 08/27/2017 0406   CO2 24 08/29/2017 0802   CO2 23 08/27/2017 0406   BUN 14 08/29/2017 0802   BUN 12 08/27/2017 0406   CREATININE 0.68 08/29/2017 0802   CREATININE 0.61 08/27/2017 0406   CREATININE 0.67 12/08/2015 1018   GLUCOSE 128 (H) 08/29/2017 0802   GLUCOSE 86 08/27/2017 0406   CALCIUM 8.5 (L) 08/29/2017 0802   CALCIUM 8.1 (L) 08/27/2017 0406   AST 23 08/26/2017 0735   AST 16 12/08/2015 1018   ALT 12 (L) 08/26/2017 0735   ALT 13 12/08/2015 1018   ALKPHOS 42 08/26/2017 0735   ALKPHOS 41 12/08/2015 1018   BILITOT 1.1 08/26/2017 0735   BILITOT 0.6 12/08/2015 1018   PROT 7.2 08/26/2017 0735   PROT 6.9 12/08/2015 1018   ALBUMIN 4.2 08/26/2017 0735   ALBUMIN 4.1 12/08/2015 1018    Studies/Results: Dg Abd Portable 1v  Result Date: 08/28/2017 CLINICAL DATA:  Small-bowel obstruction . EXAM: PORTABLE ABDOMEN - 1 VIEW COMPARISON:  08/27/2017 . FINDINGS: Soft tissue structures are unremarkable. No bowel distention. No free air. Small calcifications noted over the left kidney. Left nephrolithiasis cannot be excluded . No acute bony abnormality . IMPRESSION: 1.  No acute abnormality identified. 2. Small calcifications noted over the left kidney. Left nephrolithiasis cannot be excluded Electronically Signed   By: Marcello Moores  Register   On: 08/28/2017 06:49      Sun Jerilynn Mages 08/29/2017  Patient ID: Helen [name], female   DOB: Sep 07, 1971, 46 y.o.   MRN: [id number]

## 2017-08-30 MED ORDER — DOCUSATE SODIUM 100 MG PO CAPS
200.0000 mg | ORAL_CAPSULE | Freq: Two times a day (BID) | ORAL | Status: DC
  Administered 2017-08-30 – 2017-08-31 (×4): 200 mg via ORAL
  Filled 2017-08-30 (×3): qty 2

## 2017-08-30 MED ORDER — POLYETHYLENE GLYCOL 3350 17 G PO PACK
17.0000 g | PACK | Freq: Two times a day (BID) | ORAL | Status: DC
  Administered 2017-08-30 – 2017-08-31 (×3): 17 g via ORAL
  Filled 2017-08-30 (×3): qty 1

## 2017-08-30 NOTE — Progress Notes (Signed)
PROGRESS NOTE  Helen [name]  CNO:[id number] DOB: 09-May-1971 DOA: 08/26/2017 PCP: Patient, No Pcp Per  Outpatient Specialists: GI, Medoff Brief Narrative: Helen [name] is a 46 y.o. female with a history of Crohn's disease and EtOH abuse who presented to the ED due to RLQ abdominal pain worsening, constant, severe over the past few days. She'd developed nausea and vomiting and stopped having BM's or flatus. Evaluation with CT abdomen showed distal small bowel obstruction with transition point within wall thickening and mucosal enhancement in the terminal ileum. Also noted was a rim-enhancing fluid collection in the right lower quadrant which contains stool and measures 9.3 x 5.3 x 4.6 cm consistent with an abscess.   Subjective. Patient in bed, appears comfortable, denies any headache, no fever, no chest pain or pressure, no shortness of breath , no abdominal pain. No focal weakness.  Assessment & Plan:  Crohn's flare with ileocecal obstruction - CT shows dilated fecalized segment of terminal ileum at the ileocecal valve, Clinically as per GI and general surgery, GI Dr. Thana Farr following remotely, general surgery on board, she is currently being treated conservatively with IV steroids, IV Cipro and Flagyl, clinically no obstruction, abdomen is soft and nontender, discussed with general surgeon Dr. Harlow Asa in detail on 08/30/2017, plan is to advance her diet, if she is stable and tolerates diet discharge home with outpatient GI and general surgery follow-up.  Encouraged the patient to consider outpatient immunomodulators for Crohn's disease..   Remote history of alcohol abuse  - None currently. No evidence of withdrawal.  Discontinue CIWA   DVT prophylaxis: Lovenox Code Status: Full Family Communication: None at bedside Disposition Plan: Continue inpatient management  Consultants:   GI, Dr. Earlean Shawl  General surgery  Procedures:   None  Antimicrobials:  Ceftriaxone, flagyl 10/24 >>       Objective: Vitals:   08/29/17 0625 08/29/17 1426 08/29/17 2022 08/30/17 0619  BP: 103/60 (!) 102/54 109/74 (!) 105/56  Pulse: (!) 55 60 (!) 54 (!) 55  Resp: 16 16 15 16   Temp: 98.7 F (37.1 C) 98 F (36.7 C) 99.2 F (37.3 C) 99 F (37.2 C)  TempSrc: Oral Oral Oral Oral  SpO2: 98% 99% 98% 99%  Weight: 72.2 kg (159 lb 3.2 oz)   72.1 kg (158 lb 15.2 oz)  Height:        Intake/Output Summary (Last 24 hours) at 08/30/17 0953 Last data filed at 08/30/17 2947  Gross per 24 hour  Intake             1440 ml  Output                0 ml  Net             1440 ml   Filed Weights   08/27/17 0500 08/29/17 0625 08/30/17 0619  Weight: 72.6 kg (160 lb) 72.2 kg (159 lb 3.2 oz) 72.1 kg (158 lb 15.2 oz)    Physical exam  Awake Alert, Oriented X 3, No new F.N deficits, Normal affect [place].AT,PERRAL Supple Neck,No JVD, No cervical lymphadenopathy appriciated.  Symmetrical Chest wall movement, Good air movement bilaterally, CTAB RRR,No Gallops, Rubs or new Murmurs, No Parasternal Heave +ve B.Sounds, Abd Soft, No tenderness, No organomegaly appriciated, No rebound - guarding or rigidity. No Cyanosis, Clubbing or edema, No new Rash or bruise    Data Reviewed: I have personally reviewed following labs and imaging studies  CBC:  Recent Labs Lab 08/26/17 0735 08/26/17 1613 08/27/17 0406 08/29/17  0346  WBC 20.8* 15.0* 11.6* 13.8*  HGB 13.1 11.7* 11.6* 11.0*  HCT 37.8 33.7* 33.2* 32.4*  MCV 91.7 91.8 92.2 92.0  PLT 146* 155 147* 250   Basic Metabolic Panel:  Recent Labs Lab 08/26/17 0735 08/26/17 1613 08/27/17 0406 08/29/17 0802  NA 137  --  138 136  K 4.4  --  3.9 4.0  CL 105  --  108 105  CO2 20*  --  23 24  GLUCOSE 141*  --  86 128*  BUN 14  --  12 14  CREATININE 0.88 0.73 0.61 0.68  CALCIUM 8.9  --  8.1* 8.5*  MG  --   --   --  1.8   GFR: Estimated Creatinine Clearance: 100.3 mL/min (by C-G formula based on SCr of 0.68 mg/dL). Liver Function Tests:  Recent  Labs Lab 08/26/17 0735  AST 23  ALT 12*  ALKPHOS 42  BILITOT 1.1  PROT 7.2  ALBUMIN 4.2    Recent Labs Lab 08/26/17 0735  LIPASE 22   No results for input(s): AMMONIA in the last 168 hours. Coagulation Profile:  Recent Labs Lab 08/27/17 0406  INR 1.15   Cardiac Enzymes: No results for input(s): CKTOTAL, CKMB, CKMBINDEX, TROPONINI in the last 168 hours. BNP (last 3 results) No results for input(s): PROBNP in the last 8760 hours. HbA1C: No results for input(s): HGBA1C in the last 72 hours. CBG: No results for input(s): GLUCAP in the last 168 hours. Lipid Profile: No results for input(s): CHOL, HDL, LDLCALC, TRIG, CHOLHDL, LDLDIRECT in the last 72 hours. Thyroid Function Tests: No results for input(s): TSH, T4TOTAL, FREET4, T3FREE, THYROIDAB in the last 72 hours. Anemia Panel: No results for input(s): VITAMINB12, FOLATE, FERRITIN, TIBC, IRON, RETICCTPCT in the last 72 hours. Urine analysis:    Component Value Date/Time   COLORURINE YELLOW 08/26/2017 1216   APPEARANCEUR CLEAR 08/26/2017 1216   LABSPEC >1.046 (H) 08/26/2017 1216   PHURINE 5.0 08/26/2017 1216   GLUCOSEU NEGATIVE 08/26/2017 1216   HGBUR SMALL (A) 08/26/2017 [place] 08/26/2017 1216   KETONESUR 20 (A) 08/26/2017 1216   PROTEINUR NEGATIVE 08/26/2017 1216   NITRITE NEGATIVE 08/26/2017 1216   LEUKOCYTESUR NEGATIVE 08/26/2017 1216   No results found for this or any previous visit (from the past 240 hour(s)).    Radiology Studies: Dg Abd 2 Views  Result Date: 08/29/2017 CLINICAL DATA:  Nausea.  History of Crohn's disease. EXAM: ABDOMEN - 2 VIEW COMPARISON:  Plain film of the abdomen dated 08/28/2017. CT abdomen dated 08/26/2017. FINDINGS: Mildly distended gas-filled loops of small and large bowel within the central abdomen, with associated air-fluid levels, suggesting continued partial obstruction and/or ileus. No evidence of soft tissue mass. No evidence of free intraperitoneal air.  Lung bases appear clear. IMPRESSION: Findings compatible with persistent partial obstruction of the distal small bowel and probable colonic ileus, compatible with the distal small bowel obstruction described on earlier CT abdomen of 08/26/2017. Electronically Signed   By: Franki Cabot M.D.   On: 08/29/2017 11:06    Scheduled Meds: . docusate sodium  200 mg Oral BID  . folic acid  1 mg Oral Daily  . heparin  5,000 Units Subcutaneous Q8H  . methylPREDNISolone (SOLU-MEDROL) injection  20 mg Intravenous Q6H  . multivitamin with minerals  1 tablet Oral Daily  . polyethylene glycol  17 g Oral BID  . thiamine  100 mg Oral Daily   Or  . thiamine  100 mg Intravenous  Daily   Continuous Infusions: . cefTRIAXone (ROCEPHIN)  IV Stopped (08/30/17 2694)   And  . metronidazole Stopped (08/30/17 0254)     LOS: 4 days   Signature  Lala Lund M.D on 08/30/2017 at 9:53 AM  Between 7am to 7pm - Pager - [phone] ( page via [email or web address], text pages only, please mention full 10 digit call back number).  After 7pm go to [email or web address] - password [email or web address]

## 2017-08-30 NOTE — Progress Notes (Signed)
General Surgery [facility]  Assessment & Plan: Hx of Crohn's Disease             - patient's GI MD Dr. Earlean Shawl Crohn's flare with ileocecal obstruction             - agree with advancing diet as tolerated  Will follow with you.        Earnstine Regal, MD, [facility]       Office: [phone]    Chief Complaint: Small bowel obstruction  Subjective: Patient pain-free this AM, tolerating clear liquid breakfast.  No BM yet.  Objective: Vital signs in last 24 hours: Temp:  [98 F (36.7 C)-99.2 F (37.3 C)] 99 F (37.2 C) (10/28 0619) Pulse Rate:  [54-60] 55 (10/28 0619) Resp:  [15-16] 16 (10/28 0619) BP: (102-109)/(54-74) 105/56 (10/28 0619) SpO2:  [98 %-99 %] 99 % (10/28 0619) Weight:  [72.1 kg (158 lb 15.2 oz)] 72.1 kg (158 lb 15.2 oz) (10/28 0619) Last BM Date: 08/25/17  Intake/Output from previous day: 10/27 0701 - 10/28 0700 In: 1440 [P.O.:1440] Out: -  Intake/Output this shift: No intake/output data recorded.  Physical Exam: HEENT - sclerae clear, mucous membranes moist Neck - soft Chest - clear bilaterally Cor - RRR Abdomen - soft without distension; non-tender; no mass Ext - no edema, non-tender Neuro - alert & oriented, no focal deficits  Lab Results:   Recent Labs  08/29/17 0346  WBC 13.8*  HGB 11.0*  HCT 32.4*  PLT 179   BMET  Recent Labs  08/29/17 0802  NA 136  K 4.0  CL 105  CO2 24  GLUCOSE 128*  BUN 14  CREATININE 0.68  CALCIUM 8.5*   PT/INR No results for input(s): LABPROT, INR in the last 72 hours. Comprehensive Metabolic Panel:    Component Value Date/Time   NA 136 08/29/2017 0802   NA 138 08/27/2017 0406   K 4.0 08/29/2017 0802   K 3.9 08/27/2017 0406   CL 105 08/29/2017 0802   CL 108 08/27/2017 0406   CO2 24 08/29/2017 0802   CO2 23 08/27/2017 0406   BUN 14 08/29/2017 0802   BUN 12 08/27/2017 0406   CREATININE 0.68 08/29/2017 0802   CREATININE 0.61 08/27/2017  0406   CREATININE 0.67 12/08/2015 1018   GLUCOSE 128 (H) 08/29/2017 0802   GLUCOSE 86 08/27/2017 0406   CALCIUM 8.5 (L) 08/29/2017 0802   CALCIUM 8.1 (L) 08/27/2017 0406   AST 23 08/26/2017 0735   AST 16 12/08/2015 1018   ALT 12 (L) 08/26/2017 0735   ALT 13 12/08/2015 1018   ALKPHOS 42 08/26/2017 0735   ALKPHOS 41 12/08/2015 1018   BILITOT 1.1 08/26/2017 0735   BILITOT 0.6 12/08/2015 1018   PROT 7.2 08/26/2017 0735   PROT 6.9 12/08/2015 1018   ALBUMIN 4.2 08/26/2017 0735   ALBUMIN 4.1 12/08/2015 1018    Studies/Results: Dg Abd 2 Views  Result Date: 08/29/2017 CLINICAL DATA:  Nausea.  History of Crohn's disease. EXAM: ABDOMEN - 2 VIEW COMPARISON:  Plain film of the abdomen dated 08/28/2017. CT abdomen dated 08/26/2017. FINDINGS: Mildly distended gas-filled loops of small and large bowel within the central abdomen, with associated air-fluid levels, suggesting continued partial obstruction and/or ileus. No evidence of soft tissue mass. No evidence of free intraperitoneal air. Lung bases appear clear. IMPRESSION: Findings compatible with persistent partial obstruction of the distal small bowel and probable colonic ileus,  compatible with the distal small bowel obstruction described on earlier CT abdomen of 08/26/2017. Electronically Signed   By: Franki Cabot M.D.   On: 08/29/2017 11:06      [place] M 08/30/2017  Patient ID: Helen [name], female   DOB: 1971-02-08, 46 y.o.   MRN: [id number]

## 2017-08-31 LAB — CBC
HEMATOCRIT: 33.8 % — AB (ref 36.0–46.0)
HEMOGLOBIN: 11.6 g/dL — AB (ref 12.0–15.0)
MCH: 31.9 pg (ref 26.0–34.0)
MCHC: 34.3 g/dL (ref 30.0–36.0)
MCV: 92.9 fL (ref 78.0–100.0)
Platelets: 223 10*3/uL (ref 150–400)
RBC: 3.64 MIL/uL — AB (ref 3.87–5.11)
RDW: 13.2 % (ref 11.5–15.5)
WBC: 9.8 10*3/uL (ref 4.0–10.5)

## 2017-08-31 LAB — BASIC METABOLIC PANEL
Anion gap: 7 (ref 5–15)
BUN: 14 mg/dL (ref 6–20)
CHLORIDE: 106 mmol/L (ref 101–111)
CO2: 25 mmol/L (ref 22–32)
Calcium: 8.4 mg/dL — ABNORMAL LOW (ref 8.9–10.3)
Creatinine, Ser: 0.74 mg/dL (ref 0.44–1.00)
GFR calc non Af Amer: >60 mL/min (ref >60)
Glucose, Bld: 114 mg/dL — ABNORMAL HIGH (ref 65–99)
POTASSIUM: 4.3 mmol/L (ref 3.5–5.1)
SODIUM: 138 mmol/L (ref 135–145)

## 2017-08-31 MED ORDER — MAGNESIUM CITRATE PO SOLN
1.0000 | Freq: Once | ORAL | Status: AC
  Administered 2017-08-31: 1 via ORAL
  Filled 2017-08-31: qty 296

## 2017-08-31 MED ORDER — DIPHENHYDRAMINE HCL 25 MG PO CAPS
25.0000 mg | ORAL_CAPSULE | Freq: Two times a day (BID) | ORAL | Status: DC | PRN
  Administered 2017-08-31: 25 mg via ORAL
  Filled 2017-08-31: qty 1

## 2017-08-31 NOTE — Progress Notes (Signed)
Pharmacy IV to PO conversion  The patient is receiving diphenhydramine by the intravenous route.  Based on the following criteria approved by the Pharmacy and [place], diphenhydramine is being converted to the equivalent oral dose form.   Not prescribed to treat or prevent a severe allergic reaction   Not prescribed as premedication prior to receiving blood product, biologic medication, antimicrobial, or chemotherapy agent   The patient has tolerated at least one dose of an oral or enteral medication   The patient has no evidence of active gastrointestinal bleeding or impaired GI absorption (gastrectomy, short bowel, patient on TNA or NPO).   The patient is not undergoing procedural sedation  If you have any questions about this conversion, please contact the Pharmacy Department (ext [phone]).  Thank you.  Reuel Boom, PharmD Pager: [phone] 08/31/2017, 1:03 PM

## 2017-08-31 NOTE — Progress Notes (Signed)
PROGRESS NOTE  Helen [name]  FHL:[id number] DOB: September 13, 1971 DOA: 08/26/2017 PCP: Patient, No Pcp Per  Outpatient Specialists: GI, Medoff Brief Narrative: Helen [name] is a 46 y.o. female with a history of Crohn's disease and EtOH abuse who presented to the ED due to RLQ abdominal pain worsening, constant, severe over the past few days. She'd developed nausea and vomiting and stopped having BM's or flatus. Evaluation with CT abdomen showed distal small bowel obstruction with transition point within wall thickening and mucosal enhancement in the terminal ileum. Also noted was a rim-enhancing fluid collection in the right lower quadrant which contains stool and measures 9.3 x 5.3 x 4.6 cm consistent with an abscess.   Subjective. Patient in bed, appears comfortable, denies any headache, no fever, no chest pain or pressure, no shortness of breath , no abdominal pain. No focal weakness.  Assessment & Plan:  Crohn's flare with ileocecal obstruction - CT shows dilated fecalized segment of terminal ileum at the ileocecal valve, Clinically as per GI and general surgery, GI Dr. Thana Farr following remotely, general surgery on board, she is currently being treated conservatively with IV steroids, IV Cipro and Flagyl, clinically no obstruction, abdomen is soft and nontender, discussed with general surgeon Dr. Harlow Asa in detail on 08/30/2017, she is clinically better toleratingsoft diet, however no BM yet, await patient having bowel movements thereafter discharged home on oral steroids, outpatient follow-up with GI and general surgery.    Remote history of alcohol abuse  - None currently. No evidence of withdrawal.  Discontinue CIWA   DVT prophylaxis: Lovenox Code Status: Full Family Communication: None at bedside Disposition Plan: Continue inpatient management  Consultants:   GI, Dr. Earlean Shawl  General surgery  Procedures:   None  Antimicrobials:  Ceftriaxone, flagyl 10/24 >>       Objective: Vitals:   08/30/17 1446 08/30/17 2050 08/31/17 0446 08/31/17 0916  BP: 117/64 108/67 109/69   Pulse: (!) 57 64 60   Resp: 16 18 18    Temp: 98.3 F (36.8 C) 98.6 F (37 C) 98.6 F (37 C)   TempSrc: Oral Oral Oral   SpO2: 99% 99% 98% 100%  Weight:   72.4 kg (159 lb 11.2 oz)   Height:        Intake/Output Summary (Last 24 hours) at 08/31/17 1019 Last data filed at 08/31/17 0600  Gross per 24 hour  Intake             1400 ml  Output                0 ml  Net             1400 ml   Filed Weights   08/29/17 0625 08/30/17 0619 08/31/17 0446  Weight: 72.2 kg (159 lb 3.2 oz) 72.1 kg (158 lb 15.2 oz) 72.4 kg (159 lb 11.2 oz)    Physical exam  Awake Alert, Oriented X 3, No new F.N deficits, Normal affect [place].AT,PERRAL Supple Neck,No JVD, No cervical lymphadenopathy appriciated.  Symmetrical Chest wall movement, Good air movement bilaterally, CTAB RRR,No Gallops, Rubs or new Murmurs, No Parasternal Heave +ve B.Sounds, Abd Soft, No tenderness, No organomegaly appriciated, No rebound - guarding or rigidity. No Cyanosis, Clubbing or edema, No new Rash or bruise     Data Reviewed: I have personally reviewed following labs and imaging studies  CBC:  Recent Labs Lab 08/26/17 0735 08/26/17 1613 08/27/17 0406 08/29/17 0346 08/31/17 0406  WBC 20.8* 15.0* 11.6* 13.8* 9.8  HGB 13.1  11.7* 11.6* 11.0* 11.6*  HCT 37.8 33.7* 33.2* 32.4* 33.8*  MCV 91.7 91.8 92.2 92.0 92.9  PLT 146* 155 147* 179 101   Basic Metabolic Panel:  Recent Labs Lab 08/26/17 0735 08/26/17 1613 08/27/17 0406 08/29/17 0802 08/31/17 0406  NA 137  --  138 136 138  K 4.4  --  3.9 4.0 4.3  CL 105  --  108 105 106  CO2 20*  --  23 24 25   GLUCOSE 141*  --  86 128* 114*  BUN 14  --  12 14 14   CREATININE 0.88 0.73 0.61 0.68 0.74  CALCIUM 8.9  --  8.1* 8.5* 8.4*  MG  --   --   --  1.8  --    GFR: Estimated Creatinine Clearance: 100.3 mL/min (by C-G formula based on SCr of 0.74  mg/dL). Liver Function Tests:  Recent Labs Lab 08/26/17 0735  AST 23  ALT 12*  ALKPHOS 42  BILITOT 1.1  PROT 7.2  ALBUMIN 4.2    Recent Labs Lab 08/26/17 0735  LIPASE 22   No results for input(s): AMMONIA in the last 168 hours. Coagulation Profile:  Recent Labs Lab 08/27/17 0406  INR 1.15   Cardiac Enzymes: No results for input(s): CKTOTAL, CKMB, CKMBINDEX, TROPONINI in the last 168 hours. BNP (last 3 results) No results for input(s): PROBNP in the last 8760 hours. HbA1C: No results for input(s): HGBA1C in the last 72 hours. CBG: No results for input(s): GLUCAP in the last 168 hours. Lipid Profile: No results for input(s): CHOL, HDL, LDLCALC, TRIG, CHOLHDL, LDLDIRECT in the last 72 hours. Thyroid Function Tests: No results for input(s): TSH, T4TOTAL, FREET4, T3FREE, THYROIDAB in the last 72 hours. Anemia Panel: No results for input(s): VITAMINB12, FOLATE, FERRITIN, TIBC, IRON, RETICCTPCT in the last 72 hours. Urine analysis:    Component Value Date/Time   COLORURINE YELLOW 08/26/2017 1216   APPEARANCEUR CLEAR 08/26/2017 1216   LABSPEC >1.046 (H) 08/26/2017 1216   PHURINE 5.0 08/26/2017 1216   GLUCOSEU NEGATIVE 08/26/2017 1216   HGBUR SMALL (A) 08/26/2017 [place] 08/26/2017 1216   KETONESUR 20 (A) 08/26/2017 1216   PROTEINUR NEGATIVE 08/26/2017 1216   NITRITE NEGATIVE 08/26/2017 1216   LEUKOCYTESUR NEGATIVE 08/26/2017 1216   No results found for this or any previous visit (from the past 240 hour(s)).    Radiology Studies: No results found.  Scheduled Meds: . docusate sodium  200 mg Oral BID  . folic acid  1 mg Oral Daily  . heparin  5,000 Units Subcutaneous Q8H  . magnesium citrate  1 Bottle Oral Once  . methylPREDNISolone (SOLU-MEDROL) injection  20 mg Intravenous Q6H  . multivitamin with minerals  1 tablet Oral Daily  . polyethylene glycol  17 g Oral BID  . thiamine  100 mg Oral Daily   Continuous Infusions: . cefTRIAXone  (ROCEPHIN)  IV 2 g (08/31/17 0816)   And  . metronidazole Stopped (08/31/17 0351)     LOS: 5 days   Signature  Lala Lund M.D on 08/31/2017 at 10:19 AM  Between 7am to 7pm - Pager - [phone] ( page via [email or web address], text pages only, please mention full 10 digit call back number).  After 7pm go to [email or web address] - password [email or web address]

## 2017-08-31 NOTE — Progress Notes (Signed)
[facility] Progress Note     Subjective: CC: no BM Patient with occasional gas pains. Passing flatus, no BM. Tolerating diet, denies n/v, abdominal distention.  UOP good. VSS.   Objective: Vital signs in last 24 hours: Temp:  [98.3 F (36.8 C)-98.6 F (37 C)] 98.6 F (37 C) (10/29 0446) Pulse Rate:  [57-64] 60 (10/29 0446) Resp:  [16-18] 18 (10/29 0446) BP: (108-117)/(64-69) 109/69 (10/29 0446) SpO2:  [98 %-99 %] 98 % (10/29 0446) Weight:  [72.4 kg (159 lb 11.2 oz)] 72.4 kg (159 lb 11.2 oz) (10/29 0446) Last BM Date: 08/25/17  Intake/Output from previous day: 10/28 0701 - 10/29 0700 In: 1720 [P.O.:1520; IV Piggyback:200] Out: -  Intake/Output this shift: No intake/output data recorded.  PE: Gen:  Alert, NAD, pleasant Card:  Regular rate and rhythm Pulm:  Normal effort, clear to auscultation bilaterally Abd: Soft, non-tender, non-distended, bowel sounds present, no HSM Skin: warm and dry, no rashes  Psych: A&Ox3   Lab Results:   Recent Labs  08/29/17 0346 08/31/17 0406  WBC 13.8* 9.8  HGB 11.0* 11.6*  HCT 32.4* 33.8*  PLT 179 223   BMET  Recent Labs  08/29/17 0802 08/31/17 0406  NA 136 138  K 4.0 4.3  CL 105 106  CO2 24 25  GLUCOSE 128* 114*  BUN 14 14  CREATININE 0.68 0.74  CALCIUM 8.5* 8.4*   PT/INR No results for input(s): LABPROT, INR in the last 72 hours. CMP     Component Value Date/Time   NA 138 08/31/2017 0406   K 4.3 08/31/2017 0406   CL 106 08/31/2017 0406   CO2 25 08/31/2017 0406   GLUCOSE 114 (H) 08/31/2017 0406   BUN 14 08/31/2017 0406   CREATININE 0.74 08/31/2017 0406   CREATININE 0.67 12/08/2015 1018   CALCIUM 8.4 (L) 08/31/2017 0406   PROT 7.2 08/26/2017 0735   ALBUMIN 4.2 08/26/2017 0735   AST 23 08/26/2017 0735   ALT 12 (L) 08/26/2017 0735   ALKPHOS 42 08/26/2017 0735   BILITOT 1.1 08/26/2017 0735   GFRNONAA >60 08/31/2017 0406   GFRAA >60 08/31/2017 0406   Lipase     Component Value Date/Time   LIPASE 22 08/26/2017 0735       Studies/Results: Dg Abd 2 Views  Result Date: 08/29/2017 CLINICAL DATA:  Nausea.  History of Crohn's disease. EXAM: ABDOMEN - 2 VIEW COMPARISON:  Plain film of the abdomen dated 08/28/2017. CT abdomen dated 08/26/2017. FINDINGS: Mildly distended gas-filled loops of small and large bowel within the central abdomen, with associated air-fluid levels, suggesting continued partial obstruction and/or ileus. No evidence of soft tissue mass. No evidence of free intraperitoneal air. Lung bases appear clear. IMPRESSION: Findings compatible with persistent partial obstruction of the distal small bowel and probable colonic ileus, compatible with the distal small bowel obstruction described on earlier CT abdomen of 08/26/2017. Electronically Signed   By: Franki Cabot M.D.   On: 08/29/2017 11:06    Anti-infectives: Anti-infectives    Start     Dose/Rate Route Frequency Ordered Stop   08/27/17 0800  cefTRIAXone (ROCEPHIN) 2 g in dextrose 5 % 50 mL IVPB     2 g 100 mL/hr over 30 Minutes Intravenous Every 24 hours 08/26/17 1608     08/26/17 1800  metroNIDAZOLE (FLAGYL) IVPB 500 mg     500 mg 100 mL/hr over 60 Minutes Intravenous Every 8 hours 08/26/17 1608     08/26/17 0815  cefTRIAXone (ROCEPHIN) 2 g in  dextrose 5 % 50 mL IVPB     2 g 100 mL/hr over 30 Minutes Intravenous  Once 08/26/17 0811 08/26/17 0907   08/26/17 0815  metroNIDAZOLE (FLAGYL) IVPB 500 mg     500 mg 100 mL/hr over 60 Minutes Intravenous  Once 08/26/17 0811 08/26/17 1017       Assessment/Plan Hx of EtOH use Hx of tobacco use   Hx of Crohn's Disease- had been dormant and patient was off medications for ~15 yrs - patient's GI MD Dr. Earlean Shawl consulted 10/24 - patient spoke with him on the phone last night Crohn's flare with ileocecal obstruction - tolerating soft diet, but no BM yet - will try mag citrate today - continue steroids and IV abx - patient may ultimately end up needing surgery for  possible stricture, can f/u in our office after GI f/u  FEN: soft diet VTE: SCDs, heparin ID: rocephin/flagyl (10/24>>), WBC trending down, afebrile   Plan: Mag citrate, await BM. Continue abx and steroids. Will need PO steroids at discharge and close f/u with GI.   LOS: 5 days    Brigid Re , [facility] 08/31/2017, 9:01 AM Pager: [phone] Consults: [phone] Mon-Fri 7:00 am-4:30 pm Sat-Sun 7:00 am-11:30 am

## 2017-09-01 ENCOUNTER — Telehealth: Payer: Self-pay | Admitting: Family Medicine

## 2017-09-01 DIAGNOSIS — K50914 Crohn's disease, unspecified, with abscess: Secondary | ICD-10-CM

## 2017-09-01 MED ORDER — DOCUSATE SODIUM 100 MG PO CAPS: 200.0000 mg | ORAL_CAPSULE | Freq: Every day | ORAL | 0 refills | Status: DC

## 2017-09-01 MED ORDER — PREDNISONE 5 MG PO TABS: ORAL_TABLET | ORAL | 0 refills | Status: DC

## 2017-09-01 MED ORDER — ACETAMINOPHEN 325 MG PO TABS: 650.0000 mg | ORAL_TABLET | Freq: Four times a day (QID) | ORAL | 0 refills | Status: DC | PRN

## 2017-09-01 MED ORDER — METRONIDAZOLE 500 MG PO TABS: 500.0000 mg | ORAL_TABLET | Freq: Three times a day (TID) | ORAL | 0 refills | Status: AC

## 2017-09-01 MED ORDER — POLYETHYLENE GLYCOL 3350 17 G PO PACK: 17.0000 g | PACK | Freq: Every day | ORAL | 0 refills | Status: DC | PRN

## 2017-09-01 NOTE — Telephone Encounter (Signed)
Pt want Legrand Como to know that she was in the hospital for Crohns Disease but shes better now

## 2017-09-01 NOTE — Discharge Summary (Signed)
Helen Helen [name] QMV:[id number] DOB: 1971-01-06 DOA: 08/26/2017  PCP: Patient, No Pcp Per  Admit date: 08/26/2017  Discharge date: 09/01/2017  Admitted From: Home  Disposition:  Home   Recommendations for Outpatient Follow-up:   Follow up with PCP in 1-2 weeks  PCP Please obtain BMP/CBC, 2 view CXR in 1week,  (see Discharge instructions)   PCP Please follow up on the following pending results: None   Home Health: None   Equipment/Devices: None  Consultations: CCS Discharge Condition: Stable   CODE STATUS: Ful   Diet Recommendation: Soft diet for the next 2-3 days then advance gradually to regular consistency as tolerated   Chief Complaint  Patient presents with  . Abdominal Pain     Brief history of present illness from the day of admission and additional interim summary    Helen Helen [name] a 46 y.o.femalewith a history of Crohn's disease and EtOH abuse who presented to the ED due to RLQ abdominal pain worsening, constant, severe over the past few days. She'd developed nausea and vomiting and stopped having BM's or flatus. Evaluation with CT abdomen showed distal small bowel obstruction with transition point within wall thickening and mucosal enhancement in the terminal ileum. Also noted Helen [name] a rim-enhancing fluid collection in the right lower quadrant which contains stool and measures 9.3 x 5.3 x 4.6 cm consistent with an abscess.  Helen Helen [name] seen by general surgery who were not impressed with the CT findings and treated her conservatively.  Patient is clinically better will be discharged with outpatient GI and general surgery follow-up.                                                                 Hospital Course    Crohn's flare with ileocecal obstruction - CT shows dilated fecalized segment of terminal ileum  at the ileocecal valve, Clinically as per GI and general surgery, GI Dr. Thana Farr following remotely, general surgery on board, surgery did not think that she had any abscess clinically, she Helen [name] treated conservatively with IV steroids, IV Cipro and Flagyl, clinically no obstruction, abdomen Helen [name] soft and nontender, discussed with general surgeon Dr. Harlow Asa in detail on 08/30/2017, she tolerated soft diet had multiple bowel movements and is completely symptom-free.  Will be discharged home on 7 more days of oral Flagyl, steroid taper with close outpatient GI follow-up with her GI physician Dr. Thana Farr and consider outpatient Crohn's treatment.  If obstruction occurs again surgery can be considered..    Remote history of alcohol abuse  - None currently. No evidence of withdrawal.  Discontinued CIWA   Discharge diagnosis     Principal Problem:   Bowel obstruction (HCC) Active Problems:   History of ETOH abuse   Crohn's disease with abscess [facility])    Discharge instructions    Discharge  Instructions    Discharge instructions    Complete by:  As directed    Follow with Primary MD and your GI physician Dr. Thana Farr in 7 days   Get CBC, CMP, B12, checked  by Primary MD in 5-7 days    Activity: As tolerated with Full fall precautions use walker/cane & assistance as needed  Disposition Home   Diet: Soft diet for the next 2-3 days then advance gradually to regular consistency as tolerated  For Heart failure patients - Check your Weight same time everyday, if you gain over 2 pounds, or you develop in leg swelling, experience more shortness of breath or chest pain, call your Primary MD immediately. Follow Cardiac Low Salt Diet and 1.5 lit/day fluid restriction.  On your next visit with your primary care physician please Get Medicines reviewed and adjusted.  Please request your Prim.MD to go over all Hospital Tests and Procedure/Radiological results at the follow up, please get all Hospital records  sent to your Prim MD by signing hospital release before you go home.  If you experience worsening of your admission symptoms, develop shortness of breath, life threatening emergency, suicidal or homicidal thoughts you must seek medical attention immediately by calling 911 or calling your MD immediately  if symptoms less severe.  You Must read complete instructions/literature along with all the possible adverse reactions/side effects for all the Medicines you take and that have been prescribed to you. Take any new Medicines after you have completely understood and accpet all the possible adverse reactions/side effects.   Do not drive, operate heavy machinery, perform activities at heights, swimming or participation in water activities or provide baby sitting services if your were admitted for syncope or siezures until you have seen by Primary MD or a Neurologist and advised to do so again.  Do not drive when taking Pain medications.    Do not take more than prescribed Pain, Sleep and Anxiety Medications  Special Instructions: If you have smoked or chewed Tobacco  in the last 2 yrs please stop smoking, stop any regular Alcohol  and or any Recreational drug use.  Wear Seat belts while driving.   Please note  You were cared for by a hospitalist during your hospital stay. If you have any questions about your discharge medications or the care you received while you were in the hospital after you are discharged, you can call the unit and asked to speak with the hospitalist on call if the hospitalist that took care of you is not available. Once you are discharged, your primary care physician will handle any further medical issues. Please note that NO REFILLS for any discharge medications will be authorized once you are discharged, as it is imperative that you return to your primary care physician (or establish a relationship with a primary care physician if you do not have one) for your aftercare needs  so that they can reassess your need for medications and monitor your lab values.   Increase activity slowly    Complete by:  As directed       Discharge Medications   Allergies as of 09/01/2017      Reactions   Other    Recovering alcoholic - prefers no narcotic's       Medication List    STOP taking these medications   ibuprofen 200 MG tablet Commonly known as:  ADVIL,MOTRIN     TAKE these medications   acetaminophen 325 MG tablet Commonly known as:  TYLENOL Take  2 tablets (650 mg total) by mouth every 6 (six) hours as needed for mild pain or moderate pain (or Fever >/= 101).   docusate sodium 100 MG capsule Commonly known as:  COLACE Take 2 capsules (200 mg total) by mouth daily.   metroNIDAZOLE 500 MG tablet Commonly known as:  FLAGYL Take 1 tablet (500 mg total) by mouth 3 (three) times daily.   polyethylene glycol packet Commonly known as:  MIRALAX / GLYCOLAX Take 17 g by mouth daily as needed.   predniSONE 5 MG tablet Commonly known as:  DELTASONE Label  & dispense according to the schedule below. Take 8 Pills PO for 6 days, 6 Pills PO for 3 days, 4 Pills PO for 3 days, 2 Pills PO for 3 days, 1 Pills PO for 3 days, 1/2 Pill  PO for 3 days then STOP. Total 85 pills.       Follow-up Information    Medoff, Dellis Filbert, MD. Schedule an appointment as soon as possible for a visit in 1 week(s).   Specialty:  Gastroenterology Contact information: [place] [address] [phone]        Armandina Gemma, MD. Schedule an appointment as soon as possible for a visit.   Specialty:  General Surgery Why:  as needed Contact information: [address] [place] [address] [phone]           Major procedures and Radiology Reports - PLEASE review detailed and final reports thoroughly  -         Ct Abdomen Pelvis W Contrast  Addendum Date: 08/26/2017   ADDENDUM REPORT: 08/26/2017 14:55 ADDENDUM: On further review, the 9 cm  rim enhancing collection containing stool in the right lower quadrant could represent fecalized material within the distal small bowel lumen, rather than an adjacent extraluminal abscess. Delayed CT images to allow passage of oral contrast material may be helpful for further evaluation of this finding. Electronically Signed   By: Earle Gell M.D.   On: 08/26/2017 14:55   Result Date: 08/26/2017 CLINICAL DATA:  Right lower quadrant pain beginning yesterday. Nausea and vomiting. Diarrhea. Crohn's disease. EXAM: CT ABDOMEN AND PELVIS WITH CONTRAST TECHNIQUE: Multidetector CT imaging of the abdomen and pelvis Helen [name] performed using the standard protocol following bolus administration of intravenous contrast. CONTRAST:  100 mL Isovue-300 COMPARISON:  None. FINDINGS: Lower Chest: No acute findings. Hepatobiliary: Several low-attenuation liver lesions are seen, largest in the central right hepatic lobe adjacent to the right portal vein measuring 1.6 cm. These have nonspecific features. Gallbladder is unremarkable. Pancreas:  No mass or inflammatory changes. Spleen: Within normal limits in size and appearance. Adrenals/Urinary Tract: No masses identified. No evidence of hydronephrosis. Stomach/Bowel: A distal small bowel obstruction is seen with transition point in the terminal ileum. Moderate wall thickening and mucosal enhancement is seen involving terminal ileum with adjacent inflammatory changes, consistent with Crohn's disease. In addition, there is a rim enhancing fluid collection in the right lower quadrant which contains stool and measures 9.3 by 5.3 x 4.6 cm. This is consistent with abscess. Vascular/Lymphatic: No pathologically enlarged lymph nodes. No abdominal aortic aneurysm. Reproductive: No mass or other significant abnormality. Tiny amount of free fluid noted in pelvis. Other:  None. Musculoskeletal:  No suspicious bone lesions identified. IMPRESSION: Distal small bowel obstruction due to moderate wall  thickening and inflammatory changes involving the terminal ileum, consistent with active Crohn's disease. 9 x 5 x 5 cm rim enhancing collection containing stool in right  lower quadrant, consistent with abscess. Several indeterminate low-attenuation liver lesions, largest measuring 1.6 cm. Nonemergent abdomen MRI without and with contrast is recommended for further characterization. Electronically Signed: By: Earle Gell M.D. On: 08/26/2017 12:15   Dg Abd 2 Views  Result Date: 08/29/2017 CLINICAL DATA:  Nausea.  History of Crohn's disease. EXAM: ABDOMEN - 2 VIEW COMPARISON:  Plain film of the abdomen dated 08/28/2017. CT abdomen dated 08/26/2017. FINDINGS: Mildly distended gas-filled loops of small and large bowel within the central abdomen, with associated air-fluid levels, suggesting continued partial obstruction and/or ileus. No evidence of soft tissue mass. No evidence of free intraperitoneal air. Lung bases appear clear. IMPRESSION: Findings compatible with persistent partial obstruction of the distal small bowel and probable colonic ileus, compatible with the distal small bowel obstruction described on earlier CT abdomen of 08/26/2017. Electronically Signed   By: Franki Cabot M.D.   On: 08/29/2017 11:06   Dg Abd 2 Views  Result Date: 08/27/2017 CLINICAL DATA:  Follow-up small bowel obstruction. EXAM: ABDOMEN - 2 VIEW COMPARISON:  CT scan 08/26/2017. FINDINGS: The bowel gas pattern appears improved, with less small bowel distention, when technique differences to CT scan are considered. Enteric contrast can be seen within the colon. There appears to be less stool burden. IMPRESSION: Improving small bowel obstruction. Electronically Signed   By: Staci Righter M.D.   On: 08/27/2017 08:37   Dg Abd Portable 1v  Result Date: 08/28/2017 CLINICAL DATA:  Small-bowel obstruction . EXAM: PORTABLE ABDOMEN - 1 VIEW COMPARISON:  08/27/2017 . FINDINGS: Soft tissue structures are unremarkable. No bowel  distention. No free air. Small calcifications noted over the left kidney. Left nephrolithiasis cannot be excluded . No acute bony abnormality . IMPRESSION: 1.  No acute abnormality identified. 2. Small calcifications noted over the left kidney. Left nephrolithiasis cannot be excluded Electronically Signed   By: [place]   On: 08/28/2017 06:49    Micro Results     No results found for this or any previous visit (from the past 240 hour(s)).  Today   Subjective    Machelle Raybon today has no headache,no chest abdominal pain,no new weakness tingling or numbness, feels much better wants to go home today.     Objective   Blood pressure 109/70, pulse 63, temperature 98.1 F (36.7 C), temperature source Oral, resp. rate 16, height 5' 11"  (1.803 m), weight 72.4 kg (159 lb 11.2 oz), last menstrual period 07/29/2017, SpO2 100 %.   Intake/Output Summary (Last 24 hours) at 09/01/17 1014 Last data filed at 08/31/17 1500  Gross per 24 hour  Intake              390 ml  Output                0 ml  Net              390 ml    Exam Awake Alert, Oriented x 3, No new F.N deficits, Normal affect [place].AT,PERRAL Supple Neck,No JVD, No cervical lymphadenopathy appriciated.  Symmetrical Chest wall movement, Good air movement bilaterally, CTAB RRR,No Gallops,Rubs or new Murmurs, No Parasternal Heave +ve B.Sounds, Abd Soft, Non tender, No organomegaly appriciated, No rebound -guarding or rigidity. No Cyanosis, Clubbing or edema, No new Rash or bruise   Data Review   CBC w Diff:  Lab Results  Component Value Date   WBC 9.8 08/31/2017   HGB 11.6 (L) 08/31/2017   HCT 33.8 (L) 08/31/2017   PLT 223 08/31/2017  CMP:  Lab Results  Component Value Date   NA 138 08/31/2017   K 4.3 08/31/2017   CL 106 08/31/2017   CO2 25 08/31/2017   BUN 14 08/31/2017   CREATININE 0.74 08/31/2017   CREATININE 0.67 12/08/2015   PROT 7.2 08/26/2017   ALBUMIN 4.2 08/26/2017   BILITOT 1.1 08/26/2017    ALKPHOS 42 08/26/2017   AST 23 08/26/2017   ALT 12 (L) 08/26/2017  .   Total Time in preparing paper work, data evaluation and todays exam - 63 minutes  Lala Lund M.D on 09/01/2017 at 10:14 AM  [facility]   Office  [phone]

## 2017-09-01 NOTE — Discharge Instructions (Signed)
Follow with Primary MD and your GI physician Dr. Thana Farr in 7 days   Get CBC, CMP, B12, checked  by Primary MD in 5-7 days    Activity: As tolerated with Full fall precautions use walker/cane & assistance as needed  Disposition Home   Diet: Soft diet for the next 2-3 days then advance gradually to regular consistency as tolerated  For Heart failure patients - Check your Weight same time everyday, if you gain over 2 pounds, or you develop in leg swelling, experience more shortness of breath or chest pain, call your Primary MD immediately. Follow Cardiac Low Salt Diet and 1.5 lit/day fluid restriction.  On your next visit with your primary care physician please Get Medicines reviewed and adjusted.  Please request your Prim.MD to go over all Hospital Tests and Procedure/Radiological results at the follow up, please get all Hospital records sent to your Prim MD by signing hospital release before you go home.  If you experience worsening of your admission symptoms, develop shortness of breath, life threatening emergency, suicidal or homicidal thoughts you must seek medical attention immediately by calling 911 or calling your MD immediately  if symptoms less severe.  You Must read complete instructions/literature along with all the possible adverse reactions/side effects for all the Medicines you take and that have been prescribed to you. Take any new Medicines after you have completely understood and accpet all the possible adverse reactions/side effects.   Do not drive, operate heavy machinery, perform activities at heights, swimming or participation in water activities or provide baby sitting services if your were admitted for syncope or siezures until you have seen by Primary MD or a Neurologist and advised to do so again.  Do not drive when taking Pain medications.    Do not take more than prescribed Pain, Sleep and Anxiety Medications  Special Instructions: If you have smoked or chewed  Tobacco  in the last 2 yrs please stop smoking, stop any regular Alcohol  and or any Recreational drug use.  Wear Seat belts while driving.   Please note  You were cared for by a hospitalist during your hospital stay. If you have any questions about your discharge medications or the care you received while you were in the hospital after you are discharged, you can call the unit and asked to speak with the hospitalist on call if the hospitalist that took care of you is not available. Once you are discharged, your primary care physician will handle any further medical issues. Please note that NO REFILLS for any discharge medications will be authorized once you are discharged, as it is imperative that you return to your primary care physician (or establish a relationship with a primary care physician if you do not have one) for your aftercare needs so that they can reassess your need for medications and monitor your lab values.

## 2017-09-02 NOTE — Telephone Encounter (Signed)
Just an FYI

## 2017-09-03 NOTE — Care Management Important Message (Signed)
Important Message  Patient Details  Name: Helen [name] MRN: [id number] Date of Birth: August 30, 1971   Medicare Important Message Given:  Yes    Saben Donigan [address] 09/03/2017, 12:32 PM

## 2017-09-04 DIAGNOSIS — K50812 Crohn's disease of both small and large intestine with intestinal obstruction: Secondary | ICD-10-CM | POA: Diagnosis not present

## 2017-10-02 DIAGNOSIS — K50812 Crohn's disease of both small and large intestine with intestinal obstruction: Secondary | ICD-10-CM | POA: Diagnosis not present

## 2017-10-07 DIAGNOSIS — G8918 Other acute postprocedural pain: Secondary | ICD-10-CM | POA: Diagnosis not present

## 2017-10-07 DIAGNOSIS — K50812 Crohn's disease of both small and large intestine with intestinal obstruction: Secondary | ICD-10-CM | POA: Diagnosis not present

## 2017-10-07 DIAGNOSIS — K50818 Crohn's disease of both small and large intestine with other complication: Secondary | ICD-10-CM | POA: Diagnosis not present

## 2017-11-04 ENCOUNTER — Encounter: Payer: Self-pay | Admitting: Physician Assistant

## 2017-11-04 ENCOUNTER — Ambulatory Visit (INDEPENDENT_AMBULATORY_CARE_PROVIDER_SITE_OTHER): Payer: Commercial Managed Care - PPO | Admitting: Physician Assistant

## 2017-11-04 VITALS — BP 122/72 | HR 64 | Temp 98.4°F | Resp 17 | Ht 70.5 in | Wt 158.0 lb

## 2017-11-04 DIAGNOSIS — K50814 Crohn's disease of both small and large intestine with abscess: Secondary | ICD-10-CM

## 2017-11-04 LAB — POCT CBC
Granulocyte percent: 68.9 %G (ref 37–80)
HEMATOCRIT: 35.9 % — AB (ref 37.7–47.9)
Hemoglobin: 11.7 g/dL — AB (ref 12.2–16.2)
LYMPH, POC: 1.7 (ref 0.6–3.4)
MCH, POC: 31.3 pg — AB (ref 27–31.2)
MCHC: 32.5 g/dL (ref 31.8–35.4)
MCV: 96.3 fL (ref 80–97)
MID (cbc): 0.5 (ref 0–0.9)
MPV: 8.6 fL (ref 0–99.8)
PLATELET COUNT, POC: 194 10*3/uL (ref 142–424)
POC GRANULOCYTE: 4.8 (ref 2–6.9)
POC LYMPH PERCENT: 24.5 %L (ref 10–50)
POC MID %: 6.6 %M (ref 0–12)
RBC: 3.73 M/uL — AB (ref 4.04–5.48)
RDW, POC: 13.9 %
WBC: 6.9 10*3/uL (ref 4.6–10.2)

## 2017-11-04 NOTE — Progress Notes (Signed)
11/04/2017 5:43 PM   DOB: 04/11/1971 / MRN: [id number]  SUBJECTIVE:  Helen [name] is a 47 y.o. female presenting for follow up.  Was found to have Chorn disease and had a partial colectomy after some discussion with Dr. Earlean Shawl at [facility]. She feels well today and denies incision site pain and redness.  She is moving her bowels normally for her. She was instructed to get some labs by Dr. Earlean Shawl to decrease the possibility of abdominal reactivity.   She is a recovering alcoholic sober 16 years now.   She is allergic to other.   She  has a past medical history of Alcohol abuse, Allergy, and Crohn disease ([place]).    She  reports that she quit smoking about 4 years ago. Her smoking use included cigarettes. She started smoking about 28 years ago. She uses smokeless tobacco. She reports that she does not drink alcohol or use drugs. She  reports that she does not engage in sexual activity. The patient  has a past surgical history that includes Colonoscopy.  Her family history includes Aneurysm in her father; Cancer in her paternal grandmother; Mental illness in her father.  Review of Systems  Constitutional: Negative for chills, diaphoresis and fever.  Eyes: Negative.   Respiratory: Negative for cough, hemoptysis, sputum production, shortness of breath and wheezing.   Cardiovascular: Negative for chest pain, orthopnea and leg swelling.  Gastrointestinal: Negative for nausea.  Skin: Negative for rash.  Neurological: Negative for dizziness, sensory change, speech change, focal weakness and headaches.    The problem list and medications were reviewed and updated by myself where necessary and exist elsewhere in the encounter.   OBJECTIVE:  BP 122/72   Pulse 64   Temp 98.4 F (36.9 C) (Oral)   Resp 17   Ht 5' 10.5" (1.791 m)   Wt 158 lb (71.7 kg)   SpO2 98%   BMI 22.35 kg/m   Wt Readings from Last 3 Encounters:  11/04/17 158 lb (71.7 kg)  08/31/17 159 lb 11.2 oz (72.4 kg)    03/28/17 166 lb 9.6 oz (75.6 kg)     Physical Exam  Constitutional: She is active.  Non-toxic appearance.  Cardiovascular: Normal rate, regular rhythm, S1 normal, S2 normal, normal heart sounds and intact distal pulses. Exam reveals no gallop, no friction rub and no decreased pulses.  No murmur heard. Pulmonary/Chest: Effort normal. No stridor. No tachypnea. No respiratory distress. She has no wheezes. She has no rales.  Abdominal: Soft. Bowel sounds are normal. She exhibits no distension and no mass. There is no tenderness. There is no rebound and no guarding.  Musculoskeletal: She exhibits no edema.  Neurological: She is alert.  Skin: Skin is warm and dry. She is not diaphoretic. No pallor.    No results found for this or any previous visit (from the past 72 hour(s)).  No results found.  ASSESSMENT AND PLAN:  [name] was seen today for follow-up and wants labs for crohns.  Diagnoses and all orders for this visit:  Crohn's disease of both small and large intestine with abscess ([place]) -     Thyroid Peroxidase Antibody -     TSH -     T4, Free -     T3, Free -     POCT CBC -     Allergens(96) Foods -     Renal Function Panel -     Type and screen; Future    The patient is advised  to call or return to clinic if she does not see an improvement in symptoms, or to seek the care of the closest emergency department if she worsens with the above plan.   Philis Fendt, MHS, PA-C [facility] 11/04/2017 5:43 PM

## 2017-11-04 NOTE — Patient Instructions (Signed)
     IF you received an x-ray today, you will receive an invoice from [facility]. Please contact [facility] at [phone] with questions or concerns regarding your invoice.   IF you received labwork today, you will receive an invoice from [facility]. Please contact [facility] at [phone] with questions or concerns regarding your invoice.   Our billing staff will not be able to assist you with questions regarding bills from these companies.  You will be contacted with the lab results as soon as they are available. The fastest way to get your results is to activate your My Chart account. Instructions are located on the last page of this paperwork. If you have not heard from us regarding the results in 2 weeks, please contact this office.     

## 2017-11-06 ENCOUNTER — Encounter: Payer: Self-pay | Admitting: Physician Assistant

## 2017-11-06 DIAGNOSIS — K50814 Crohn's disease of both small and large intestine with abscess: Secondary | ICD-10-CM | POA: Diagnosis not present

## 2017-11-06 LAB — RENAL FUNCTION PANEL
Albumin: 4.3 g/dL (ref 3.5–5.5)
BUN / CREAT RATIO: 11 (ref 9–23)
BUN: 9 mg/dL (ref 6–24)
CALCIUM: 9.2 mg/dL (ref 8.7–10.2)
CO2: 23 mmol/L (ref 20–29)
CREATININE: 0.82 mg/dL (ref 0.57–1.00)
Chloride: 105 mmol/L (ref 96–106)
GFR calc Af Amer: 101 mL/min/{1.73_m2} (ref >59)
GFR, EST NON AFRICAN AMERICAN: 87 mL/min/{1.73_m2} (ref >59)
GLUCOSE: 90 mg/dL (ref 65–99)
PHOSPHORUS: 3.6 mg/dL (ref 2.5–4.5)
POTASSIUM: 4.4 mmol/L (ref 3.5–5.2)
SODIUM: 142 mmol/L (ref 134–144)

## 2017-11-06 LAB — ALLERGENS(96) FOODS
Allergen Barley IgE: <0.1 kU/L
Allergen Black Pepper IgE: <0.1 kU/L
Allergen Blueberry IgE: <0.1 kU/L
Allergen Broccoli: <0.1 kU/L
Allergen Cabbage IgE: <0.1 kU/L
Allergen Carrot IgE: <0.1 kU/L
Allergen Cinnamon IgE: <0.1 kU/L
Allergen Coconut IgE: <0.1 kU/L
Allergen Cucumber IgE: <0.1 kU/L
Allergen Garlic IgE: <0.1 kU/L
Allergen Gluten IgE: <0.1 kU/L
Allergen Grape IgE: <0.1 kU/L
Allergen Green Bell Pepper IgE: <0.1 kU/L
Allergen Lamb IgE: <0.1 kU/L
Allergen Lettuce IgE: <0.1 kU/L
Allergen Melon IgE: <0.1 kU/L
Allergen Oat IgE: <0.1 kU/L
Allergen Pear IgE: <0.1 kU/L
Allergen Rice IgE: <0.1 kU/L
Allergen Turkey IgE: <0.1 kU/L
Allergen, Peach f95: <0.1 kU/L
Basil: <0.1 kU/L
C074-IgE Gelatin: <0.1 kU/L
Chicken IgE: <0.1 kU/L
Chocolate/Cacao IgE: <0.1 kU/L
Coffee: <0.1 kU/L
F076-IgE Alpha Lactalbumin: <0.1 kU/L
F077-IgE Beta Lactoglobulin: <0.1 kU/L
F081-IgE Cheese, Cheddar Type: <0.1 kU/L
F089-IgE Mustard: <0.1 kU/L
F202-IgE Cashew Nut: <0.1 kU/L
F214-IgE Spinach: <0.1 kU/L
F222-IgE Tea: <0.1 kU/L
F247-IgE Honey: <0.1 kU/L
F262-IgE Eggplant: <0.1 kU/L
F270-IGE GINGER: 0.1 kU/L — AB
F279-IgE Chili Pepper: <0.1 kU/L
F300-IgE Goat's Milk: <0.1 kU/L
F342-IgE Olive, Black: <0.1 kU/L
F343-IgE Raspberry: <0.1 kU/L
Hops: <0.1 kU/L
IgE Egg (Yolk): <0.1 kU/L
Kidney Bean IgE: <0.1 kU/L
Lemon: <0.1 kU/L
Malt: <0.1 kU/L
Orange: <0.1 kU/L
Peanut IgE: <0.1 kU/L
Pork IgE: <0.1 kU/L
Red Beet: <0.1 kU/L
Rye IgE: <0.1 kU/L
Scallop IgE: <0.1 kU/L
Sesame Seed IgE: <0.1 kU/L
Shrimp IgE: <0.1 kU/L
Soybean IgE: <0.1 kU/L
Tuna: <0.1 kU/L
Walnut IgE: <0.1 kU/L
Wheat IgE: <0.1 kU/L

## 2017-11-06 LAB — TSH: TSH: 1.93 u[IU]/mL (ref 0.450–4.500)

## 2017-11-06 LAB — T3, FREE: T3, Free: 2.8 pg/mL (ref 2.0–4.4)

## 2017-11-06 LAB — THYROID PEROXIDASE ANTIBODY: Thyroperoxidase Ab SerPl-aCnc: 7 IU/mL (ref 0–34)

## 2017-11-06 LAB — T4, FREE: Free T4: 1.33 ng/dL (ref 0.82–1.77)

## 2017-11-06 LAB — MAGNESIUM: Magnesium: 2.1 mg/dL (ref 1.6–2.3)

## 2017-11-07 LAB — ABO/RH: RH TYPE: POSITIVE

## 2017-11-09 ENCOUNTER — Encounter: Payer: Self-pay | Admitting: Physician Assistant

## 2017-11-17 DIAGNOSIS — R5383 Other fatigue: Secondary | ICD-10-CM | POA: Diagnosis not present

## 2017-11-17 DIAGNOSIS — G4700 Insomnia, unspecified: Secondary | ICD-10-CM | POA: Diagnosis not present

## 2017-11-17 DIAGNOSIS — K50812 Crohn's disease of both small and large intestine with intestinal obstruction: Secondary | ICD-10-CM | POA: Diagnosis not present

## 2017-12-21 ENCOUNTER — Ambulatory Visit (INDEPENDENT_AMBULATORY_CARE_PROVIDER_SITE_OTHER): Payer: Commercial Managed Care - PPO | Admitting: Physician Assistant

## 2017-12-21 ENCOUNTER — Encounter: Payer: Self-pay | Admitting: Physician Assistant

## 2017-12-21 ENCOUNTER — Ambulatory Visit (INDEPENDENT_AMBULATORY_CARE_PROVIDER_SITE_OTHER): Payer: Commercial Managed Care - PPO

## 2017-12-21 ENCOUNTER — Other Ambulatory Visit: Payer: Self-pay

## 2017-12-21 VITALS — BP 100/74 | HR 69 | Temp 98.6°F | Resp 16 | Ht 70.5 in | Wt 158.6 lb

## 2017-12-21 DIAGNOSIS — M79675 Pain in left toe(s): Secondary | ICD-10-CM | POA: Diagnosis not present

## 2017-12-21 DIAGNOSIS — S92425A Nondisplaced fracture of distal phalanx of left great toe, initial encounter for closed fracture: Secondary | ICD-10-CM

## 2017-12-21 DIAGNOSIS — Z87891 Personal history of nicotine dependence: Secondary | ICD-10-CM | POA: Diagnosis not present

## 2017-12-21 DIAGNOSIS — K50812 Crohn's disease of both small and large intestine with intestinal obstruction: Secondary | ICD-10-CM | POA: Diagnosis not present

## 2017-12-21 DIAGNOSIS — K600 Acute anal fissure: Secondary | ICD-10-CM | POA: Diagnosis not present

## 2017-12-21 NOTE — Patient Instructions (Addendum)
I have placed a referral for orthopedics and they should contact you in 1-2 weeks. If you have not heard from them by then, please follow here up in 2 weeks. You may use crutches for the next few days. Keep your foot in a hard sole shoe with it buddy taped to the second toe until you follow up. Apply ice to affected area 4-5 x a day for 20 minutes at a time. Keep foot elevateda t home. Thank you for letting me participate in your health and well being.    Toe Fracture A toe fracture is a break in one of the toe bones (phalanges). Follow these instructions at home: If you have a cast:  Do not stick anything inside the cast to scratch your skin.  Check the skin around the cast every day. Tell your doctor about any concerns. Do not put lotion on the skin underneath the cast. You may put lotion on dry skin around the edges of the cast.  Do not put pressure on any part of the cast until it is fully hardened. This may take many hours.  Keep the cast clean and dry. Bathing  Do not take baths, swim, or use a hot tub until your doctor says that you can. Ask your doctor if you can take showers. You may only be allowed to take sponge baths for bathing.  If your doctor says that bathing and showering are okay, cover the cast or bandage (dressing) with a watertight plastic bag to protect it from water. Do not let the cast or bandage get wet. Managing pain, stiffness, and swelling  If you do not have a cast, put ice on the injured area if told by your doctor: ? Put ice in a plastic bag. ? Place a towel between your skin and the bag. ? Leave the ice on for 20 minutes, 2-3 times per day.  Move your toes often to avoid stiffness and to lessen swelling.  Raise (elevate) the injured area above the level of your heart while you are sitting or lying down. Driving  Do not drive or use heavy machinery while taking pain medicine.  Do not drive while wearing a cast on a foot that you use for  driving. Activity  Return to your normal activities as told by your doctor. Ask your doctor what activities are safe for you.  Perform exercises daily as told by your doctor or therapist. Safety  Do not use your leg to support your body weight until your doctor says that you can. Use crutches or other tools to help you move around as told by your doctor. General instructions  If your toe was taped to a toe that is next to it (buddy taping), follow your doctor's instructions for changing the gauze and tape. Change it more often: ? If the gauze and tape get wet. If this happens, dry the space between the toes. ? If the gauze and tape are too tight and they cause your toe to become pale or to lose feeling (numb).  Wear a protective shoe as told by your doctor. If you were not given one, wear sturdy shoes that support your foot. Your shoes should not pinch your toes. Your shoes should not fit tightly against your toes.  Do not use any tobacco products, including cigarettes, chewing tobacco, or e-cigarettes. Tobacco can delay bone healing. If you need help quitting, ask your doctor.  Take medicines only as told by your doctor.  Keep all  follow-up visits as told by your doctor. This is important. Contact a doctor if:  You have a fever.  Your pain medicine is not helping.  Your toe feels cold.  You lose feeling (have numbness) in your toe.  You still have pain after one week of rest and treatment.  You still have pain after your doctor has said that you can start walking again.  You have pain or tingling in your foot, and it is not going away.  You have loss of feeling in your foot, and it is not going away. Get help right away if:  You have severe pain.  You have redness or swelling (inflammation) in your toe, and it is getting worse.  You have pain or loss of feeling in your toe, and it is getting worse.  Your toe is blue. This information is not intended to replace advice  given to you by your health care provider. Make sure you discuss any questions you have with your health care provider. Document Released: 04/07/2008 Document Revised: 06/23/2016 Document Reviewed: 08/16/2014 Elsevier Interactive Patient Education  2018 [employer].   IF you received an x-ray today, you will receive an invoice from [facility]. Please contact [facility] at [phone] with questions or concerns regarding your invoice.   IF you received labwork today, you will receive an invoice from [place]. Please contact [facility] at [phone] with questions or concerns regarding your invoice.   Our billing staff will not be able to assist you with questions regarding bills from these companies.  You will be contacted with the lab results as soon as they are available. The fastest way to get your results is to activate your My Chart account. Instructions are located on the last page of this paperwork. If you have not heard from [place] regarding the results in 2 weeks, please contact this office.

## 2017-12-21 NOTE — Progress Notes (Signed)
Helen [name]  MRN: [id number] DOB: 1970-12-29  Subjective:  Helen [name] is a 47 y.o. female seen in office today for a chief complaint of injury to left great toe x 3 days ago.  Notes she was jumping on trampoline and was trying to do a trick and landed on her foot.  She has been having continued left great toe pain since injury.  She has associated swelling, bruising, and pain with ambulation.  Denies numbness, tingling, change in skin color, head injury,  and ankle injury.  She she has not tried anything for relief.   Review of Systems  Constitutional: Negative for chills and fever.  Gastrointestinal: Negative for nausea and vomiting.  Neurological: Negative for dizziness.                                         Patient Active Problem List   Diagnosis Date Noted  . Crohn's disease with abscess ([place])   . Bowel obstruction ([place]) 08/26/2017  . History of ETOH abuse 08/26/2017    No current outpatient medications on file prior to visit.   No current facility-administered medications on file prior to visit.     Allergies  Allergen Reactions  . Ginger Rash  . Other     Recovering alcoholic - prefers no narcotic's       Social History   Socioeconomic History  . Marital status: Single    Spouse name: Not on file  . Number of children: Not on file  . Years of education: Not on file  . Highest education level: Not on file  Social Needs  . Financial resource strain: Not on file  . Food insecurity - worry: Not on file  . Food insecurity - inability: Not on file  . Transportation needs - medical: Not on file  . Transportation needs - non-medical: Not on file  Occupational History  . Not on file  Tobacco Use  . Smoking status: Former Smoker    Types: Cigarettes    Start date: 01/23/1989    Last attempt to quit: 10/25/2013    Years since quitting: 4.1  . Smokeless tobacco: Current User  Substance and Sexual Activity  . Alcohol use: No    Alcohol/week: 0.0 oz    Comment:  former   . Drug use: No  . Sexual activity: No    Birth control/protection: Abstinence  Other Topics Concern  . Not on file  Social History Narrative  . Not on file    Objective:  There were no vitals taken for this visit.  Physical Exam  Constitutional: She is oriented to person, place, and time and well-developed, well-nourished, and in no distress.  HENT:  Head: Normocephalic and atraumatic.  Eyes: Conjunctivae are normal.  Neck: Normal range of motion.  Cardiovascular:  Pulses:      Dorsalis pedis pulses are 2+ on the right side, and 2+ on the left side.       Posterior tibial pulses are 2+ on the right side, and 2+ on the left side.  Pulmonary/Chest: Effort normal.  Musculoskeletal:       Left ankle: Normal. No tenderness. Achilles tendon normal.       Left foot: There is bony tenderness and swelling. There is normal capillary refill.       Feet:  Neurological: She is alert and oriented to person, place, and time. Gait normal.  Skin: Skin is warm and dry. No abrasion and no laceration noted.  Psychiatric: Affect normal.  Vitals reviewed.   Dg Toe Great Left  Result Date: 12/21/2017 CLINICAL DATA:  injury to left big toe EXAM: LEFT GREAT TOE COMPARISON:  None. FINDINGS: There is a transverse fracture of the distal phalanx of the great toe, not associated with significant displacement. There is mild dorsal angulation distal fracture fragment. IMPRESSION: Fracture of the distal phalanx of the great toe. Electronically Signed   By: Nolon Nations M.D.   On: 12/21/2017 17:32   Great toe buddy taped to second toe.  2 x 2 gauze used for padding between great toe and second toe. Post op shoe placed.   Assessment and Plan :  1. Toe pain, left - DG Toe Great Left; Future  2. Nondisplaced fracture of distal phalanx of left great toe, initial encounter for closed fracture Plain films with fracture of distal phalanx of great toe.  No nail involvement.  No overlying laceration  or abrasion.  She is neurovascularly intact.  Postop shoe and crutches provided.  Great toe was buddy taped to second toe.  Patient given educational material on toe fracture and educated on how to use crutches.  Recommend she keep toe immobilized until follow-up.  Encouraged to apply ice to affected area 4-5 times a day for 20 minutes at a time and use over-the-counter ibuprofen as prescribed as needed for pain and swelling.  Plan to follow-up with orthopedics for further evaluation and management.  If she does not hear from them in 2 weeks, return here for evaluation while awaiting referral. - Ambulatory referral to Orthopedic Surgery - [place] PA-C  [place] [facility] [place] 12/21/2017 5:19 PM

## 2017-12-22 ENCOUNTER — Encounter: Payer: Self-pay | Admitting: Physician Assistant

## 2018-02-04 DIAGNOSIS — K5090 Crohn's disease, unspecified, without complications: Secondary | ICD-10-CM | POA: Diagnosis not present

## 2018-02-04 DIAGNOSIS — K5900 Constipation, unspecified: Secondary | ICD-10-CM | POA: Diagnosis not present

## 2018-02-19 ENCOUNTER — Other Ambulatory Visit: Payer: Self-pay

## 2018-02-19 ENCOUNTER — Encounter: Payer: Self-pay | Admitting: Physician Assistant

## 2018-02-19 ENCOUNTER — Ambulatory Visit (INDEPENDENT_AMBULATORY_CARE_PROVIDER_SITE_OTHER): Payer: Commercial Managed Care - PPO | Admitting: Physician Assistant

## 2018-02-19 VITALS — BP 102/60 | HR 80 | Temp 98.2°F | Resp 18 | Ht 70.5 in | Wt 160.6 lb

## 2018-02-19 DIAGNOSIS — R21 Rash and other nonspecific skin eruption: Secondary | ICD-10-CM | POA: Diagnosis not present

## 2018-02-19 MED ORDER — CLOTRIMAZOLE-BETAMETHASONE 1-0.05 % EX CREA: 1.0000 "application " | TOPICAL_CREAM | Freq: Two times a day (BID) | CUTANEOUS | 0 refills | Status: DC

## 2018-02-19 NOTE — Progress Notes (Signed)
    02/19/2018 1:05 PM   DOB: Oct 14, 1971 / MRN: [id number]  SUBJECTIVE:  Helen [name] is a 47 y.o. female presenting for rash in the left axilla.  Is been present  in the last month and is getting worse despite Nizoral cream.  She is allergic to ginger and other.   She  has a past medical history of Alcohol abuse, Allergy, and Crohn disease ([place]).    She  reports that she quit smoking about 4 years ago. Her smoking use included cigarettes. She started smoking about 29 years ago. She uses smokeless tobacco. She reports that she does not drink alcohol or use drugs. She  reports that she does not engage in sexual activity. The patient  has a past surgical history that includes Colonoscopy.  Her family history includes Aneurysm in her father; Cancer in her paternal grandmother; Mental illness in her father.  Review of Systems  Constitutional: Negative for chills, diaphoresis and fever.  Respiratory: Negative for shortness of breath.   Cardiovascular: Negative for chest pain, orthopnea and leg swelling.  Gastrointestinal: Negative for nausea.  Skin: Positive for itching and rash.  Neurological: Negative for dizziness.    The problem list and medications were reviewed and updated by myself where necessary and exist elsewhere in the encounter.   OBJECTIVE:  BP 102/60 (BP Location: Left Arm, Patient Position: Sitting, Cuff Size: Normal)   Pulse 80   Temp 98.2 F (36.8 C) (Oral)   Resp 18   Ht 5' 10.5" (1.791 m)   Wt 160 lb 9.6 oz (72.8 kg)   LMP 02/05/2018   SpO2 99%   BMI 22.72 kg/m   Physical Exam  Constitutional: She is oriented to person, place, and time. She appears well-nourished. No distress.  Eyes: Pupils are equal, round, and reactive to light. EOM are normal.  Cardiovascular: Normal rate.  Pulmonary/Chest: Effort normal.  Abdominal: She exhibits no distension.  Neurological: She is alert and oriented to person, place, and time. No cranial nerve deficit. Gait normal.    Skin: Skin is dry. She is not diaphoretic.  Psychiatric: She has a normal mood and affect.  Vitals reviewed.      No results found for this or any previous visit (from the past 72 hour(s)).  No results found.  ASSESSMENT AND PLAN:  Elasha was seen today for rash.  Diagnoses and all orders for this visit:  Rash and nonspecific skin eruption: Ring of fire with central pallor.  This must be fungal.  Will treat as such. -     clotrimazole-betamethasone (LOTRISONE) cream; Apply 1 application topically 2 (two) times daily.    The patient is advised to call or return to clinic if she does not see an improvement in symptoms, or to seek the care of the closest emergency department if she worsens with the above plan.   Philis Fendt, MHS, PA-C Primary Care at [facility] 02/19/2018 1:05 PM

## 2018-02-19 NOTE — Patient Instructions (Signed)
     IF you received an x-ray today, you will receive an invoice from [facility]. Please contact [facility] at [phone] with questions or concerns regarding your invoice.   IF you received labwork today, you will receive an invoice from [facility]. Please contact [facility] at [phone] with questions or concerns regarding your invoice.   Our billing staff will not be able to assist you with questions regarding bills from these companies.  You will be contacted with the lab results as soon as they are available. The fastest way to get your results is to activate your My Chart account. Instructions are located on the last page of this paperwork. If you have not heard from us regarding the results in 2 weeks, please contact this office.     

## 2018-04-10 IMAGING — DX DG TOE GREAT 2+V*L*
3 series · 3 of 3 positions shown · non-contrast
Comparison: None.

CLINICAL DATA: injury to left big toe

EXAM:
LEFT GREAT TOE

[toe ap]
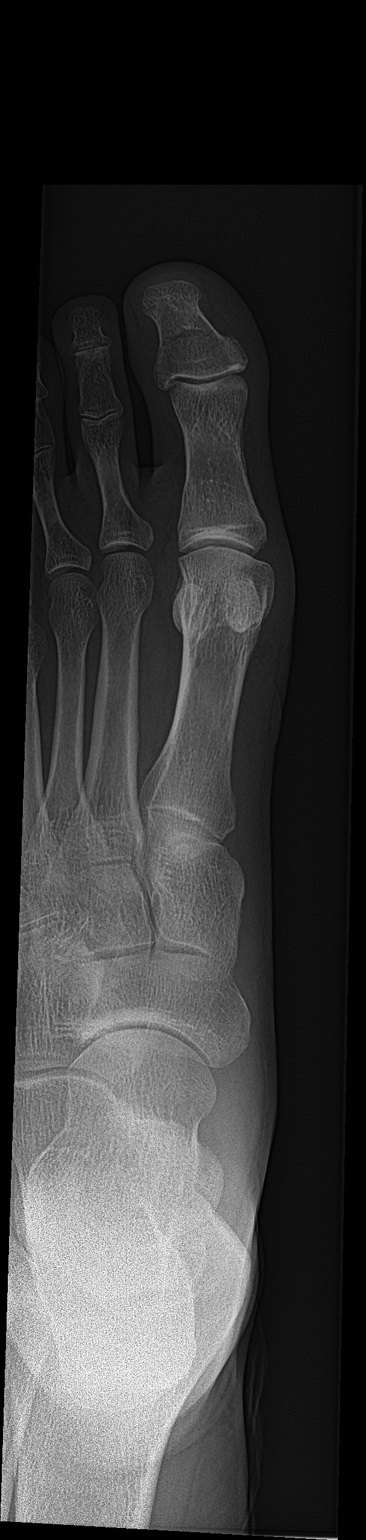

[toe obl]
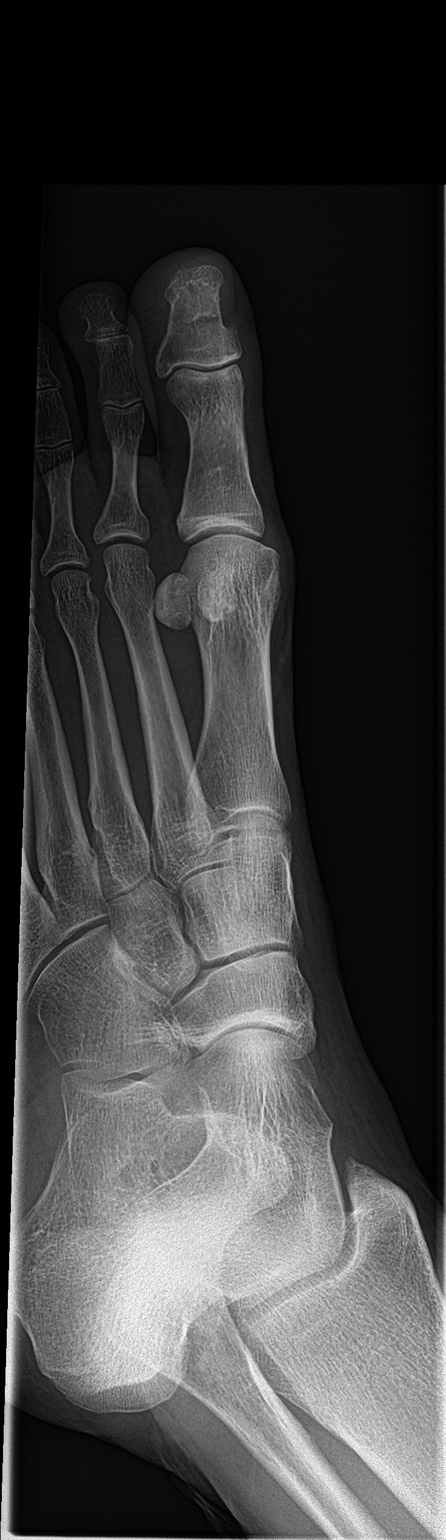

[toe lat]
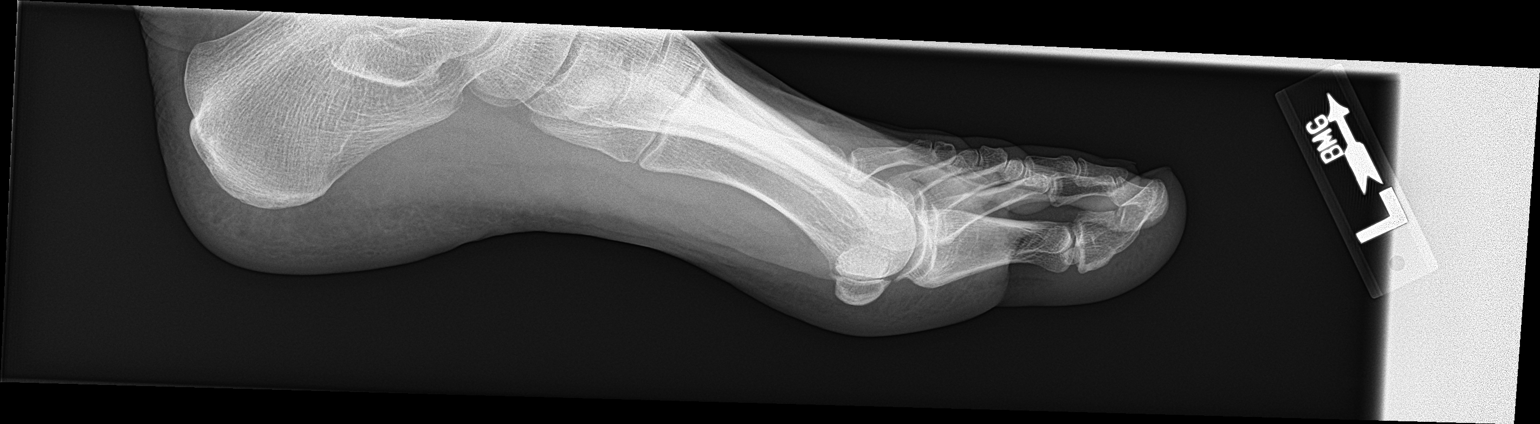

[3 of 3 positions shown; findings below may reference images not displayed]

FINDINGS: There is a transverse fracture of the distal phalanx of the great
toe, not associated with significant displacement. There is mild
dorsal angulation distal fracture fragment.
IMPRESSION: Fracture of the distal phalanx of the great toe.

## 2018-04-23 ENCOUNTER — Telehealth: Payer: Self-pay

## 2018-04-23 NOTE — Telephone Encounter (Signed)
Incoming call from pt - she verbalized that she thought her OB/Gyn was in this office.  Did not see any of our physicians listed - pt then said she thinks it is in front of [facility] - I listed some in the area and she decided she thinks it is [facility] [place] [facility] and she was going to call them.  No other needs per pt at this time.

## 2018-04-23 NOTE — Telephone Encounter (Signed)
Copied from [place]. Topic: General - Other >> Apr 23, 2018 11:33 AM Keene Breath wrote: Reason for CRM: Patient called requesting lab test for her thyroid.  She is a little confused on whether she needs a referral from the doctor or if she can just have them done.  Would like some advice what she needs to do.  CB [phone].   Pt seeing an integrative MD in [facility]. Was confused on what is needed - wants to make appt with Legrand Como to discuss.  Advised pt she had normal Thyroid blood tests Jan 2019.  She was more confused and is going to call MD in [place] to discuss.   This message is resolved.  Pt will make appt or call back

## 2018-04-27 ENCOUNTER — Encounter: Payer: Self-pay | Admitting: Physician Assistant

## 2018-05-11 DIAGNOSIS — Z980 Intestinal bypass and anastomosis status: Secondary | ICD-10-CM | POA: Diagnosis not present

## 2018-05-11 DIAGNOSIS — Z9049 Acquired absence of other specified parts of digestive tract: Secondary | ICD-10-CM | POA: Diagnosis not present

## 2018-05-11 DIAGNOSIS — K5090 Crohn's disease, unspecified, without complications: Secondary | ICD-10-CM | POA: Diagnosis not present

## 2018-05-21 ENCOUNTER — Encounter: Payer: Self-pay | Admitting: Physician Assistant

## 2018-05-21 ENCOUNTER — Ambulatory Visit (INDEPENDENT_AMBULATORY_CARE_PROVIDER_SITE_OTHER): Payer: Commercial Managed Care - PPO | Admitting: Physician Assistant

## 2018-05-21 ENCOUNTER — Other Ambulatory Visit: Payer: Self-pay

## 2018-05-21 VITALS — BP 106/62 | HR 60 | Temp 98.0°F | Resp 16 | Ht 70.5 in | Wt 170.4 lb

## 2018-05-21 DIAGNOSIS — Z0000 Encounter for general adult medical examination without abnormal findings: Secondary | ICD-10-CM

## 2018-05-21 DIAGNOSIS — Z13228 Encounter for screening for other metabolic disorders: Secondary | ICD-10-CM | POA: Diagnosis not present

## 2018-05-21 DIAGNOSIS — Z1321 Encounter for screening for nutritional disorder: Secondary | ICD-10-CM | POA: Diagnosis not present

## 2018-05-21 DIAGNOSIS — Z13220 Encounter for screening for lipoid disorders: Secondary | ICD-10-CM | POA: Diagnosis not present

## 2018-05-21 DIAGNOSIS — Z1329 Encounter for screening for other suspected endocrine disorder: Secondary | ICD-10-CM | POA: Diagnosis not present

## 2018-05-21 DIAGNOSIS — Z126 Encounter for screening for malignant neoplasm of bladder: Secondary | ICD-10-CM

## 2018-05-21 DIAGNOSIS — Z131 Encounter for screening for diabetes mellitus: Secondary | ICD-10-CM

## 2018-05-21 DIAGNOSIS — Z114 Encounter for screening for human immunodeficiency virus [HIV]: Secondary | ICD-10-CM

## 2018-05-21 MED ORDER — CLOBETASOL PROPIONATE 0.05 % EX SHAM: 1.0000 "application " | MEDICATED_SHAMPOO | Freq: Every day | CUTANEOUS | 11 refills | Status: DC

## 2018-05-21 NOTE — Progress Notes (Signed)
05/21/2018 8:42 AM   DOB: Apr 24, 1971 / MRN: [id number]  SUBJECTIVE:  Helen [name] is a 47 y.o. female presenting for an annual physical and has no complaints today.  No history of HTN.    No smoking or drinking. She had a colonoscopy with Dr. Earlean Shawl at [facility] is is free of Chron at this time and had an overall good report. Has some other labs she would like forwarded to Dr. Viviana Simpler at [facility].   Exercising copiously.   Her work is going well at this time.  She recently moved.  She plans to have a PAP smear and mammogram done.  No alcohol.   Depression screen PHQ 2/9 05/21/2018  Decreased Interest 0  Down, Depressed, Hopeless 0  PHQ - 2 Score 0     Immunization History  Administered Date(s) Administered  . Tdap 03/28/2017    Health Maintenance Due  Topic  . PAP SMEAR     She is allergic to ginger and other.   She  has a past medical history of Alcohol abuse, Allergy, and Crohn disease ([place]).    She  reports that she quit smoking about 4 years ago. Her smoking use included cigarettes. She started smoking about 29 years ago. She uses smokeless tobacco. She reports that she does not drink alcohol or use drugs. She  reports that she does not engage in sexual activity. The patient  has a past surgical history that includes Colonoscopy.  Her family history includes Aneurysm in her father; Cancer in her paternal grandmother; Mental illness in her father.  ROS  Problem list and medications reviewed and updated by myself where necessary, and exist elsewhere in the encounter.   OBJECTIVE:  BP 106/62 (BP Location: Left Arm, Patient Position: Sitting, Cuff Size: Normal)   Pulse 60   Temp 98 F (36.7 C) (Oral)   Resp 16   Ht 5' 10.5" (1.791 m)   Wt 170 lb 6.4 oz (77.3 kg)   LMP 05/07/2018   SpO2 97%   BMI 24.10 kg/m   Physical Exam  Lab Results  Component Value Date   WBC 6.9 11/04/2017   HGB 11.7 (A) 11/04/2017   HCT 35.9 (A) 11/04/2017   MCV 96.3 11/04/2017   PLT  223 08/31/2017    Lab Results  Component Value Date   NA 142 11/04/2017   K 4.4 11/04/2017   CL 105 11/04/2017   CO2 23 11/04/2017    Lab Results  Component Value Date   CREATININE 0.82 11/04/2017    Lab Results  Component Value Date   ALT 12 (L) 08/26/2017   AST 23 08/26/2017   ALKPHOS 42 08/26/2017   BILITOT 1.1 08/26/2017    Lab Results  Component Value Date   TSH 1.930 11/04/2017    Lab Results  Component Value Date   HGBA1C 5.3 03/28/2017     ASSESSMENT AND PLAN  Verl was seen today for annual exam.  Diagnoses and all orders for this visit:  Annual physical exam  Screening for metabolic disorder -     [id number]/[id number]+EXH  Screening for lipid disorders -     Lipid Panel  Screening for diabetes mellitus -     Hemoglobin A1c  Screening for malignant neoplasm of bladder -     Urinalysis, dipstick only  Screening for endocrine disorder -     Estradiol -     Progesterone -     DHEA-sulfate -     Testosterone -  Cancel: TSH -     T3, Free  Encounter for screening for nutritional disorder -     Vitamin B12 -     Vitamin D, 25-hydroxy  Screening for HIV (human immunodeficiency virus) -     HIV antibody    The patient was advised to call or return to clinic if she does not see an improvement in symptoms or to seek the care of the closest emergency department if she worsens with the above plan.   Philis Fendt, MHS, PA-C Primary Care at [facility] 05/21/2018 8:42 AM

## 2018-05-21 NOTE — Patient Instructions (Addendum)
  PLEASE HAVE THE RESULTS OF YOUR MAMMOGRAM AND PAP SMEAR SENT HERE, PLEASE   IF you received an x-ray today, you will receive an invoice from [facility]. Please contact [facility] at [phone] with questions or concerns regarding your invoice.   IF you received labwork today, you will receive an invoice from [facility]. Please contact [facility] at [phone] with questions or concerns regarding your invoice.   Our billing staff will not be able to assist you with questions regarding bills from these companies.  You will be contacted with the lab results as soon as they are available. The fastest way to get your results is to activate your My Chart account. Instructions are located on the last page of this paperwork. If you have not heard from [place] regarding the results in 2 weeks, please contact this office.

## 2018-05-22 LAB — VITAMIN B12: Vitamin B-12: 461 pg/mL (ref 232–1245)

## 2018-05-22 LAB — CMP14+CBC/D/PLT+TSH
A/G RATIO: 1.6 (ref 1.2–2.2)
ALT: 10 IU/L (ref 0–32)
AST: 15 IU/L (ref 0–40)
Albumin: 3.8 g/dL (ref 3.5–5.5)
Alkaline Phosphatase: 34 IU/L — ABNORMAL LOW (ref 39–117)
BASOS ABS: 0 10*3/uL (ref 0.0–0.2)
BASOS: 1 %
BILIRUBIN TOTAL: 0.3 mg/dL (ref 0.0–1.2)
BUN/Creatinine Ratio: 15 (ref 9–23)
BUN: 11 mg/dL (ref 6–24)
CHLORIDE: 104 mmol/L (ref 96–106)
CO2: 24 mmol/L (ref 20–29)
Calcium: 8.6 mg/dL — ABNORMAL LOW (ref 8.7–10.2)
Creatinine, Ser: 0.71 mg/dL (ref 0.57–1.00)
EOS (ABSOLUTE): 0.1 10*3/uL (ref 0.0–0.4)
Eos: 1 %
GFR calc non Af Amer: 104 mL/min/{1.73_m2} (ref >59)
GFR, EST AFRICAN AMERICAN: 120 mL/min/{1.73_m2} (ref >59)
GLOBULIN, TOTAL: 2.4 g/dL (ref 1.5–4.5)
GLUCOSE: 77 mg/dL (ref 65–99)
HEMATOCRIT: 35.4 % (ref 34.0–46.6)
Hemoglobin: 11.6 g/dL (ref 11.1–15.9)
IMMATURE GRANS (ABS): 0 10*3/uL (ref 0.0–0.1)
Immature Granulocytes: 0 %
LYMPHS ABS: 1 10*3/uL (ref 0.7–3.1)
Lymphs: 21 %
MCH: 31.7 pg (ref 26.6–33.0)
MCHC: 32.8 g/dL (ref 31.5–35.7)
MCV: 97 fL (ref 79–97)
MONOCYTES: 11 %
Monocytes Absolute: 0.5 10*3/uL (ref 0.1–0.9)
Neutrophils Absolute: 3.1 10*3/uL (ref 1.4–7.0)
Neutrophils: 66 %
PLATELETS: 173 10*3/uL (ref 150–450)
Potassium: 4.4 mmol/L (ref 3.5–5.2)
RBC: 3.66 x10E6/uL — ABNORMAL LOW (ref 3.77–5.28)
RDW: 12.4 % (ref 12.3–15.4)
SODIUM: 138 mmol/L (ref 134–144)
TSH: 2.87 u[IU]/mL (ref 0.450–4.500)
Total Protein: 6.2 g/dL (ref 6.0–8.5)
WBC: 4.7 10*3/uL (ref 3.4–10.8)

## 2018-05-22 LAB — URINALYSIS, DIPSTICK ONLY
Bilirubin, UA: NEGATIVE
GLUCOSE, UA: NEGATIVE
KETONES UA: NEGATIVE
LEUKOCYTES UA: NEGATIVE
Nitrite, UA: NEGATIVE
PROTEIN UA: NEGATIVE
RBC, UA: NEGATIVE
Specific Gravity, UA: 1.006 (ref 1.005–1.030)
UUROB: 0.2 mg/dL (ref 0.2–1.0)
pH, UA: 8 — ABNORMAL HIGH (ref 5.0–7.5)

## 2018-05-22 LAB — LIPID PANEL
CHOL/HDL RATIO: 3 ratio (ref 0.0–4.4)
Cholesterol, Total: 133 mg/dL (ref 100–199)
HDL: 44 mg/dL (ref >39)
LDL Calculated: 81 mg/dL (ref 0–99)
Triglycerides: 42 mg/dL (ref 0–149)
VLDL Cholesterol Cal: 8 mg/dL (ref 5–40)

## 2018-05-22 LAB — TESTOSTERONE: Testosterone: 47 ng/dL (ref 8–48)

## 2018-05-22 LAB — T3, FREE: T3 FREE: 2.4 pg/mL (ref 2.0–4.4)

## 2018-05-22 LAB — HEMOGLOBIN A1C
ESTIMATED AVERAGE GLUCOSE: 97 mg/dL
HEMOGLOBIN A1C: 5 % (ref 4.8–5.6)

## 2018-05-22 LAB — DHEA-SULFATE: DHEA-SO4: 161.2 ug/dL (ref 57.3–279.2)

## 2018-05-22 LAB — VITAMIN D 25 HYDROXY (VIT D DEFICIENCY, FRACTURES): VIT D 25 HYDROXY: 28.7 ng/mL — AB (ref 30.0–100.0)

## 2018-05-22 LAB — PROGESTERONE: PROGESTERONE: 0.2 ng/mL

## 2018-05-22 LAB — HIV ANTIBODY (ROUTINE TESTING W REFLEX): HIV SCREEN 4TH GENERATION: NONREACTIVE

## 2018-05-22 LAB — ESTRADIOL: Estradiol: 169.2 pg/mL

## 2018-05-31 ENCOUNTER — Encounter: Payer: Self-pay | Admitting: Physician Assistant

## 2018-06-16 DIAGNOSIS — N926 Irregular menstruation, unspecified: Secondary | ICD-10-CM | POA: Diagnosis not present

## 2018-07-02 ENCOUNTER — Ambulatory Visit: Payer: Self-pay

## 2018-07-02 NOTE — Telephone Encounter (Signed)
Patient called in with c/o "depression." She says "I am not suicidal or homicidal, but I know I am depressed. Today is my birthday week and I haven't showered in a week, I sleep all the time, feel exhausted, my work is overwhelming, I tell myself I am worthless, which I know is not the truth. Recently, about 2 months ago, I moved and I'm in a new romantic relationship, I have aging parents.  My periods are irregular, all over the place and I feel my hormones are all over the place, I feel like I am pre-menopausal. I am a recovering alcoholic for the past 17 years, so I know when I'm depressed. I have a counselor who I see, but she's out of town until 07/09/18. I really want to see a professional about what is going on, because I know I need to be on something." According to protocol, see PCP within 3 days, appointment scheduled for Tuesday, 07/06/18 at 1020 with Dr. Mitchel Honour. I called [facility] because the patient wanted to be seen sooner than 07/06/18, they advised they can do an assessment only, not prescribe medications. I advised the patient and advised her to go to the ED if her symptoms worsen over the weekend, she verbalized understanding.   Reason for Disposition . Requesting to talk with a counselor (mental health worker, psychiatrist, etc.)  Answer Assessment - Initial Assessment Questions 1. CONCERN: "What happened that made you call today?"     Haven't showered in a week, not left the house, wanting to push everyone away 2. DEPRESSION SYMPTOM SCREENING: "How are you feeling overall?" (e.g., decreased energy, increased sleeping or difficulty sleeping, difficulty concentrating, feelings of sadness, guilt, hopelessness, or worthlessness)     Work overwhelming, exhausted, sleeping all the time, tell myself I am worthless 3. RISK OF HARM - SUICIDAL IDEATION:  "Do you ever have thoughts of hurting or killing yourself?"  (e.g., yes, no, no but preoccupation with thoughts about death)   -  INTENT:  "Do you have thoughts of hurting or killing yourself right NOW?" (e.g., yes, no, N/A) No   - PLAN: "Do you have a specific plan for how you would do this?" (e.g., gun, knife, overdose, no plan, N/A)     No 4. RISK OF HARM - HOMICIDAL IDEATION:  "Do you ever have thoughts of hurting or killing someone else?"  (e.g., yes, no, no but preoccupation with thoughts about death)   - INTENT:  "Do you have thoughts of hurting or killing someone right NOW?" (e.g., yes, no, N/A) No   - PLAN: "Do you have a specific plan for how you would do this?" (e.g., gun, knife, no plan, N/A)      No 5. FUNCTIONAL IMPAIRMENT: "How have things been going for you overall in your life? Have you had any more difficulties than usual doing your normal daily activities?"  (e.g., better, same, worse; self-care, school, work, interactions)     Work is overwhelming, moved, major surgery in December, things to do to help myself doesn't make a difference 6. SUPPORT: "Who is with you now?" "Who do you live with?" "Do you have family or friends nearby who you can talk to?"      I have a roommate, boyfriend and sponsors and friends 7. THERAPIST: "Do you have a counselor or therapist? Name?"     Yes, on vacation until 07/09/18, but not a psychiatrist, doesn't prescribe medications 8. STRESSORS: "Has there been any new stress or recent changes  in your life?"     Started a romantic relationship, aging parents, work stress, moved 2 months ago 67. DRUG ABUSE/ALCOHOL: "Do you drink alcohol or use any illegal drugs?"      Recovering alcoholic 17 years; no drug use 10. OTHER: "Do you have any other health or medical symptoms right now?" (e.g., fever)       No 11. PREGNANCY: "Is there any chance you are pregnant?" "When was your last menstrual period?"       No; LMP 06/20/18  Protocols used: DEPRESSION-A-AH

## 2018-07-06 ENCOUNTER — Other Ambulatory Visit: Payer: Self-pay

## 2018-07-06 ENCOUNTER — Ambulatory Visit (INDEPENDENT_AMBULATORY_CARE_PROVIDER_SITE_OTHER): Payer: Commercial Managed Care - PPO | Admitting: Emergency Medicine

## 2018-07-06 ENCOUNTER — Encounter: Payer: Self-pay | Admitting: Emergency Medicine

## 2018-07-06 VITALS — BP 103/61 | HR 63 | Temp 98.8°F | Resp 16 | Ht 70.25 in | Wt 172.2 lb

## 2018-07-06 DIAGNOSIS — F329 Major depressive disorder, single episode, unspecified: Secondary | ICD-10-CM

## 2018-07-06 DIAGNOSIS — Z23 Encounter for immunization: Secondary | ICD-10-CM

## 2018-07-06 MED ORDER — DULOXETINE HCL 30 MG PO CPEP: 30.0000 mg | ORAL_CAPSULE | Freq: Every day | ORAL | 3 refills | Status: DC

## 2018-07-06 NOTE — Progress Notes (Signed)
Depression screen [facility] 2/9 07/06/2018 05/21/2018 02/19/2018 12/21/2017 11/04/2017  Decreased Interest 3 0 0 0 0  Down, Depressed, Hopeless 3 0 0 0 0  PHQ - 2 Score 6 0 0 0 0  Altered sleeping 2 - - - -  Tired, decreased energy 3 - - - -  Change in appetite 0 - - - -  Feeling bad or failure about yourself  3 - - - -  Trouble concentrating 3 - - - -  Moving slowly or fidgety/restless 3 - - - -  Suicidal thoughts 0 - - - -  PHQ-9 Score 20 - - - -

## 2018-07-06 NOTE — Progress Notes (Signed)
Helen [name] 47 y.o.   Chief Complaint  Patient presents with  . Depression    patient stated long time   Depression screen [facility] 2/9 07/06/2018 05/21/2018 02/19/2018 12/21/2017 11/04/2017  Decreased Interest 3 0 0 0 0  Down, Depressed, Hopeless 3 0 0 0 0  PHQ - 2 Score 6 0 0 0 0  Altered sleeping 2 - - - -  Tired, decreased energy 3 - - - -  Change in appetite 0 - - - -  Feeling bad or failure about yourself  3 - - - -  Trouble concentrating 3 - - - -  Moving slowly or fidgety/restless 3 - - - -  Suicidal thoughts 0 - - - -  PHQ-9 Score 20 - - - -    HISTORY OF PRESENT ILLNESS: This is a 47 y.o. female complaining of feeling depressed for the past several months. Nurse call on 07/02/2018 read as follows: She says "I am not suicidal or homicidal, but I know I am depressed. Today is my birthday week and I haven't showered in a week, I sleep all the time, feel exhausted, my work is overwhelming, I tell myself I am worthless, which I know is not the truth. Recently, about 2 months ago, I moved and I'm in a new romantic relationship, I have aging parents.  My periods are irregular, all over the place and I feel my hormones are all over the place, I feel like I am pre-menopausal. I am a recovering alcoholic for the past 17 years, so I know when I'm depressed. I have a counselor who I see, but she's out of town until 07/09/18. I really want to see a professional about what is going on, because I know I need to be on something."   Pertinent labs & imaging results that were available during my care of the patient were reviewed by me and considered in my medical decision making (see chart for details). Recent blood work done last July reviewed with patient.  Unremarkable labs with acceptable values. HPI   Prior to Admission medications   Medication Sig Start Date End Date Taking? Authorizing Provider  Clobetasol Propionate 0.05 % shampoo Apply 1 application topically daily. As needed. 05/21/18  Yes  Tereasa Coop, PA-C    Allergies  Allergen Reactions  . Ginger Rash  . Other     Recovering alcoholic - prefers no narcotic's     Patient Active Problem List   Diagnosis Date Noted  . Crohn's disease with abscess ([place])   . Bowel obstruction ([place]) 08/26/2017  . History of ETOH abuse 08/26/2017    Past Medical History:  Diagnosis Date  . Alcohol abuse   . Allergy   . Crohn disease [facility])     Past Surgical History:  Procedure Laterality Date  . COLONOSCOPY      Social History   Socioeconomic History  . Marital status: Single    Spouse name: Not on file  . Number of children: Not on file  . Years of education: Not on file  . Highest education level: Not on file  Occupational History  . Not on file  Social Needs  . Financial resource strain: Not on file  . Food insecurity:    Worry: Not on file    Inability: Not on file  . Transportation needs:    Medical: Not on file    Non-medical: Not on file  Tobacco Use  . Smoking status: Former Smoker  Types: Cigarettes    Start date: 01/23/1989    Last attempt to quit: 10/25/2013    Years since quitting: 4.6  . Smokeless tobacco: Current User  Substance and Sexual Activity  . Alcohol use: No    Alcohol/week: 0.0 standard drinks    Comment: former   . Drug use: No  . Sexual activity: Never    Birth control/protection: Abstinence  Lifestyle  . Physical activity:    Days per week: Not on file    Minutes per session: Not on file  . Stress: Not on file  Relationships  . Social connections:    Talks on phone: Not on file    Gets together: Not on file    Attends religious service: Not on file    Active member of club or organization: Not on file    Attends meetings of clubs or organizations: Not on file    Relationship status: Not on file  . Intimate partner violence:    Fear of current or ex partner: Not on file    Emotionally abused: Not on file    Physically abused: Not on file    Forced sexual activity:  Not on file  Other Topics Concern  . Not on file  Social History Narrative  . Not on file    Family History  Problem Relation Age of Onset  . Mental illness Father   . Aneurysm Father   . Cancer Paternal Grandmother      Review of Systems  Constitutional: Negative.  Negative for chills, fever and malaise/fatigue.  HENT: Negative.  Negative for congestion, hearing loss and sore throat.   Eyes: Negative.  Negative for blurred vision and double vision.  Respiratory: Negative.  Negative for cough and shortness of breath.   Cardiovascular: Negative.  Negative for chest pain and palpitations.  Gastrointestinal: Negative.  Negative for abdominal pain, blood in stool, diarrhea, nausea and vomiting.  Genitourinary: Negative.  Negative for dysuria and hematuria.  Musculoskeletal: Negative.   Skin: Negative.  Negative for rash.  Neurological: Negative.  Negative for dizziness, sensory change, seizures, weakness and headaches.  Endo/Heme/Allergies: Negative.   Psychiatric/Behavioral: Positive for depression and substance abuse (alcohol). Negative for suicidal ideas.  All other systems reviewed and are negative.   Vitals:   07/06/18 1043  BP: 103/61  Pulse: 63  Resp: 16  Temp: 98.8 F (37.1 C)  SpO2: 100%    Physical Exam  Constitutional: She is oriented to person, place, and time. She appears well-developed and well-nourished.  HENT:  Head: Normocephalic.  Nose: Nose normal.  Mouth/Throat: Oropharynx is clear and moist.  Eyes: Pupils are equal, round, and reactive to light. Conjunctivae and EOM are normal.  Neck: Normal range of motion. Neck supple. No JVD present.  Cardiovascular: Normal rate, regular rhythm and normal heart sounds.  Pulmonary/Chest: Effort normal and breath sounds normal.  Abdominal: Soft. Bowel sounds are normal. She exhibits no distension. There is no tenderness.  Musculoskeletal: Normal range of motion.  Lymphadenopathy:    She has no cervical  adenopathy.  Neurological: She is alert and oriented to person, place, and time. No sensory deficit. She exhibits normal muscle tone.  Skin: Skin is warm and dry. Capillary refill takes less than 2 seconds. No rash noted.  Psychiatric: She has a normal mood and affect. Her behavior is normal.  Vitals reviewed.   A total of 25 minutes was spent in the room with the patient, greater than 50% of which was in counseling/coordination  of care regarding depression, management, medications, and need for psychiatric follow-up.  ASSESSMENT & PLAN:  Danise was seen today for depression.  Diagnoses and all orders for this visit:  Major depressive disorder, remission status unspecified, unspecified whether recurrent -     Ambulatory referral to Psychiatry -     DULoxetine (CYMBALTA) 30 MG capsule; Take 1 capsule (30 mg total) by mouth daily.    Patient Instructions       If you have lab work done today you will be contacted with your lab results within the next 2 weeks.  If you have not heard from [place] then please contact us. The fastest way to get your results is to register for My Chart.   IF you received an x-ray today, you will receive an invoice from [facility]. Please contact [facility] at [phone] with questions or concerns regarding your invoice.   IF you received labwork today, you will receive an invoice from [place]. Please contact [facility] at [phone] with questions or concerns regarding your invoice.   Our billing staff will not be able to assist you with questions regarding bills from these companies.  You will be contacted with the lab results as soon as they are available. The fastest way to get your results is to activate your My Chart account. Instructions are located on the last page of this paperwork. If you have not heard from [place] regarding the results in 2 weeks, please contact this office.     Major Depressive Disorder, Adult Major  depressive disorder (MDD) is a mental health condition. MDD often makes you feel sad, hopeless, or helpless. MDD can also cause symptoms in your body. MDD can affect your:  Work.  School.  Relationships.  Other normal activities.  MDD can range from mild to very bad. It may occur once (single episode MDD). It can also occur many times (recurrent MDD). The main symptoms of MDD often include:  Feeling sad, depressed, or irritable most of the time.  Loss of interest.  MDD symptoms also include:  Sleeping too much or too little.  Eating too much or too little.  A change in your weight.  Feeling tired (fatigue) or having low energy.  Feeling worthless.  Feeling guilty.  Trouble making decisions.  Trouble thinking clearly.  Thoughts of suicide or harming others.  Feeling weak.  Feeling agitated.  Keeping yourself from being around other people (isolation).  Follow these instructions at home: Activity  Do these things as told by your doctor: ? Go back to your normal activities. ? Exercise regularly. ? Spend time outdoors. Alcohol  Talk with your doctor about how alcohol can affect your antidepressant medicines.  Do not drink alcohol. Or, limit how much alcohol you drink. ? This means no more than 1 drink a day for nonpregnant women and 2 drinks a day for men. One drink equals one of these:  12 oz of beer.  5 oz of wine.  1 oz of hard liquor. General instructions  Take over-the-counter and prescription medicines only as told by your doctor.  Eat a healthy diet.  Get plenty of sleep.  Find activities that you enjoy. Make time to do them.  Think about joining a support group. Your doctor may be able to suggest a group for you.  Keep all follow-up visits as told by your doctor. This is important. Where to find more information:  [employer] on Mental Illness: ? www.nami.[place] of Mental  Health: ? https://carter.com/  National Suicide Prevention Lifeline: ? 484-444-8428. This is free, 24-hour help. Contact a doctor if:  Your symptoms get worse.  You have new symptoms. Get help right away if:  You self-harm.  You see, hear, taste, smell, or feel things that are not present (hallucinate). If you ever feel like you may hurt yourself or others, or have thoughts about taking your own life, get help right away. You can go to your nearest emergency department or call:  Your local emergency services (911 in the [place]  A suicide crisis helpline, such as the National Suicide Prevention Lifeline: ? (626) 406-3474. This is open 24 hours a day.  This information is not intended to replace advice given to you by your health care provider. Make sure you discuss any questions you have with your health care provider. Document Released: 10/01/2015 Document Revised: 07/06/2016 Document Reviewed: 07/06/2016 Elsevier Interactive Patient Education  2017 Elsevier Inc.     Agustina Caroli, MD [facility]

## 2018-07-06 NOTE — Patient Instructions (Addendum)
If you have lab work done today you will be contacted with your lab results within the next 2 weeks.  If you have not heard from [place] then please contact us. The fastest way to get your results is to register for My Chart.   IF you received an x-ray today, you will receive an invoice from [facility]. Please contact [facility] at [phone] with questions or concerns regarding your invoice.   IF you received labwork today, you will receive an invoice from [place]. Please contact [facility] at [phone] with questions or concerns regarding your invoice.   Our billing staff will not be able to assist you with questions regarding bills from these companies.  You will be contacted with the lab results as soon as they are available. The fastest way to get your results is to activate your My Chart account. Instructions are located on the last page of this paperwork. If you have not heard from [place] regarding the results in 2 weeks, please contact this office.     Major Depressive Disorder, Adult Major depressive disorder (MDD) is a mental health condition. MDD often makes you feel sad, hopeless, or helpless. MDD can also cause symptoms in your body. MDD can affect your:  Work.  School.  Relationships.  Other normal activities.  MDD can range from mild to very bad. It may occur once (single episode MDD). It can also occur many times (recurrent MDD). The main symptoms of MDD often include:  Feeling sad, depressed, or irritable most of the time.  Loss of interest.  MDD symptoms also include:  Sleeping too much or too little.  Eating too much or too little.  A change in your weight.  Feeling tired (fatigue) or having low energy.  Feeling worthless.  Feeling guilty.  Trouble making decisions.  Trouble thinking clearly.  Thoughts of suicide or harming others.  Feeling weak.  Feeling agitated.  Keeping yourself from being around other people  (isolation).  Follow these instructions at home: Activity  Do these things as told by your doctor: ? Go back to your normal activities. ? Exercise regularly. ? Spend time outdoors. Alcohol  Talk with your doctor about how alcohol can affect your antidepressant medicines.  Do not drink alcohol. Or, limit how much alcohol you drink. ? This means no more than 1 drink a day for nonpregnant women and 2 drinks a day for men. One drink equals one of these:  12 oz of beer.  5 oz of wine.  1 oz of hard liquor. General instructions  Take over-the-counter and prescription medicines only as told by your doctor.  Eat a healthy diet.  Get plenty of sleep.  Find activities that you enjoy. Make time to do them.  Think about joining a support group. Your doctor may be able to suggest a group for you.  Keep all follow-up visits as told by your doctor. This is important. Where to find more information:  [employer] on Mental Illness: ? www.nami.[place]: ? https://carter.com/  National Suicide Prevention Lifeline: ? (904)637-8145. This is free, 24-hour help. Contact a doctor if:  Your symptoms get worse.  You have new symptoms. Get help right away if:  You self-harm.  You see, hear, taste, smell, or feel things that are not present (hallucinate). If you ever feel like you may hurt yourself or others, or have thoughts about taking your own life, get help right away. You can  go to your nearest emergency department or call:  Your local emergency services (911 in the [place]  A suicide crisis helpline, such as the National Suicide Prevention Lifeline: ? 929-604-9556. This is open 24 hours a day.  This information is not intended to replace advice given to you by your health care provider. Make sure you discuss any questions you have with your health care provider. Document Released: 10/01/2015 Document Revised: 07/06/2016 Document  Reviewed: 07/06/2016 Elsevier Interactive Patient Education  2017 [employer].

## 2018-07-06 NOTE — Addendum Note (Signed)
Addended by: Alfredia Ferguson A on: 07/06/2018 11:42 AM   Modules accepted: Orders

## 2018-07-09 DIAGNOSIS — N926 Irregular menstruation, unspecified: Secondary | ICD-10-CM | POA: Diagnosis not present

## 2018-07-09 DIAGNOSIS — Z01419 Encounter for gynecological examination (general) (routine) without abnormal findings: Secondary | ICD-10-CM | POA: Diagnosis not present

## 2018-07-09 DIAGNOSIS — Z1231 Encounter for screening mammogram for malignant neoplasm of breast: Secondary | ICD-10-CM | POA: Diagnosis not present

## 2018-07-09 DIAGNOSIS — Z3A23 23 weeks gestation of pregnancy: Secondary | ICD-10-CM | POA: Diagnosis not present

## 2018-07-09 DIAGNOSIS — Z6823 Body mass index (BMI) 23.0-23.9, adult: Secondary | ICD-10-CM | POA: Diagnosis not present

## 2018-07-09 DIAGNOSIS — Z124 Encounter for screening for malignant neoplasm of cervix: Secondary | ICD-10-CM | POA: Diagnosis not present

## 2018-08-04 ENCOUNTER — Ambulatory Visit (INDEPENDENT_AMBULATORY_CARE_PROVIDER_SITE_OTHER): Payer: Commercial Managed Care - PPO | Admitting: Psychology

## 2018-08-04 ENCOUNTER — Other Ambulatory Visit: Payer: Self-pay | Admitting: Emergency Medicine

## 2018-08-04 ENCOUNTER — Encounter: Payer: Self-pay | Admitting: Emergency Medicine

## 2018-08-04 DIAGNOSIS — F321 Major depressive disorder, single episode, moderate: Secondary | ICD-10-CM

## 2018-08-04 MED ORDER — BUPROPION HCL ER (XL) 150 MG PO TB24: 150.0000 mg | ORAL_TABLET | Freq: Every day | ORAL | 3 refills | Status: DC

## 2018-09-02 ENCOUNTER — Ambulatory Visit: Payer: Commercial Managed Care - PPO | Admitting: Psychology

## 2018-10-09 ENCOUNTER — Ambulatory Visit (INDEPENDENT_AMBULATORY_CARE_PROVIDER_SITE_OTHER): Payer: Commercial Managed Care - PPO | Admitting: Family Medicine

## 2018-10-09 ENCOUNTER — Other Ambulatory Visit: Payer: Self-pay

## 2018-10-09 ENCOUNTER — Encounter: Payer: Self-pay | Admitting: Family Medicine

## 2018-10-09 VITALS — BP 111/75 | HR 64 | Temp 98.3°F | Resp 19 | Ht 71.0 in | Wt 175.2 lb

## 2018-10-09 DIAGNOSIS — H6983 Other specified disorders of Eustachian tube, bilateral: Secondary | ICD-10-CM

## 2018-10-09 MED ORDER — FLUTICASONE PROPIONATE 50 MCG/ACT NA SUSP: 2.0000 | Freq: Every day | NASAL | 6 refills | Status: DC

## 2018-10-09 NOTE — Patient Instructions (Addendum)
If you have lab work done today you will be contacted with your lab results within the next 2 weeks.  If you have not heard from [place] then please contact us. The fastest way to get your results is to register for My Chart.   IF you received an x-ray today, you will receive an invoice from [facility]. Please contact [facility] at [phone] with questions or concerns regarding your invoice.   IF you received labwork today, you will receive an invoice from [place]. Please contact [facility] at [phone] with questions or concerns regarding your invoice.   Our billing staff will not be able to assist you with questions regarding bills from these companies.  You will be contacted with the lab results as soon as they are available. The fastest way to get your results is to activate your My Chart account. Instructions are located on the last page of this paperwork. If you have not heard from [place] regarding the results in 2 weeks, please contact this office.      Eustachian Tube Dysfunction The eustachian tube connects the middle ear to the back of the nose. It regulates air pressure in the middle ear by allowing air to move between the ear and nose. It also helps to drain fluid from the middle ear space. When the eustachian tube does not function properly, air pressure, fluid, or both can build up in the middle ear. Eustachian tube dysfunction can affect one or both ears. What are the causes? This condition happens when the eustachian tube becomes blocked or cannot open normally. This may result from:  Ear infections.  Colds and other upper respiratory infections.  Allergies.  Irritation, such as from cigarette smoke or acid from the stomach coming up into the esophagus (gastroesophageal reflux).  Sudden changes in air pressure, such as from descending in an airplane.  Abnormal growths in the nose or throat, such as nasal polyps, tumors, or enlarged tissue at the  back of the throat (adenoids).  What increases the risk? This condition may be more likely to develop in people who smoke and people who are overweight. Eustachian tube dysfunction may also be more likely to develop in children, especially children who have:  Certain birth defects of the mouth, such as cleft palate.  Large tonsils and adenoids.  What are the signs or symptoms? Symptoms of this condition may include:  A feeling of fullness in the ear.  Ear pain.  Clicking or popping noises in the ear.  Ringing in the ear.  Hearing loss.  Loss of balance.  Symptoms may get worse when the air pressure around you changes, such as when you travel to an area of high elevation or fly on an airplane. How is this diagnosed? This condition may be diagnosed based on:  Your symptoms.  A physical exam of your ear, nose, and throat.  Tests, such as those that measure: ? The movement of your eardrum (tympanogram). ? Your hearing (audiometry).  How is this treated? Treatment depends on the cause and severity of your condition. If your symptoms are mild, you may be able to relieve your symptoms by moving air into ("popping") your ears. If you have symptoms of fluid in your ears, treatment may include:  Decongestants.  Antihistamines.  Nasal sprays or ear drops that contain medicines that reduce swelling (steroids).  In some cases, you may need to have a procedure to drain the fluid in your eardrum (myringotomy). In this procedure,  a small tube is placed in the eardrum to:  Drain the fluid.  Restore the air in the middle ear space.  Follow these instructions at home:  Take over-the-counter and prescription medicines only as told by your health care provider.  Use techniques to help pop your ears as recommended by your health care provider. These may include: ? Chewing gum. ? Yawning. ? Frequent, forceful swallowing. ? Closing your mouth, holding your nose closed, and gently  blowing as if you are trying to blow air out of your nose.  Do not do any of the following until your health care provider approves: ? Travel to high altitudes. ? Fly in airplanes. ? Work in a Pension scheme manager or room. ? Scuba dive.  Keep your ears dry. Dry your ears completely after showering or bathing.  Do not smoke.  Keep all follow-up visits as told by your health care provider. This is important. Contact a health care provider if:  Your symptoms do not go away after treatment.  Your symptoms come back after treatment.  You are unable to pop your ears.  You have: ? A fever. ? Pain in your ear. ? Pain in your head or neck. ? Fluid draining from your ear.  Your hearing suddenly changes.  You become very dizzy.  You lose your balance. This information is not intended to replace advice given to you by your health care provider. Make sure you discuss any questions you have with your health care provider. Document Released: 11/16/2015 Document Revised: 03/27/2016 Document Reviewed: 11/08/2014 Elsevier Interactive Patient Education  [employer].

## 2018-10-10 NOTE — Progress Notes (Signed)
Chief Complaint  Patient presents with  . Ear Fullness    both ear feels full and feels like water in ear since 09/30/2018    HPI  Patient reports that she felt like she had water in her ears. She states that when she puts her head on the pills she hears water slowing around and it moves to the other side when she rolls over. She even felt some facial fullness the other day.  She denies ear pain or pain with chewing. She denies fevers and chills. Her ears were also itchy.   Past Medical History:  Diagnosis Date  . Alcohol abuse   . Allergy   . Crohn disease ([place])     Current Outpatient Medications  Medication Sig Dispense Refill  . buPROPion (WELLBUTRIN XL) 150 MG 24 hr tablet Take 1 tablet (150 mg total) by mouth daily. 30 tablet 3  . fluticasone (FLONASE) 50 MCG/ACT nasal spray Place 2 sprays into both nostrils daily. 16 g 6   No current facility-administered medications for this visit.     Allergies:  Allergies  Allergen Reactions  . Ginger Rash  . Other     Recovering alcoholic - prefers no narcotic's     Past Surgical History:  Procedure Laterality Date  . COLONOSCOPY      Social History   Socioeconomic History  . Marital status: Single    Spouse name: Not on file  . Number of children: Not on file  . Years of education: Not on file  . Highest education level: Not on file  Occupational History  . Not on file  Social Needs  . Financial resource strain: Not on file  . Food insecurity:    Worry: Not on file    Inability: Not on file  . Transportation needs:    Medical: Not on file    Non-medical: Not on file  Tobacco Use  . Smoking status: Former Smoker    Types: Cigarettes    Start date: 01/23/1989    Last attempt to quit: 10/25/2013    Years since quitting: 4.9  . Smokeless tobacco: Current User  Substance and Sexual Activity  . Alcohol use: No    Alcohol/week: 0.0 standard drinks    Comment: former   . Drug use: No  . Sexual activity: Never    Birth control/protection: Abstinence  Lifestyle  . Physical activity:    Days per week: Not on file    Minutes per session: Not on file  . Stress: Not on file  Relationships  . Social connections:    Talks on phone: Not on file    Gets together: Not on file    Attends religious service: Not on file    Active member of club or organization: Not on file    Attends meetings of clubs or organizations: Not on file    Relationship status: Not on file  Other Topics Concern  . Not on file  Social History Narrative  . Not on file    Family History  Problem Relation Age of Onset  . Mental illness Father   . Aneurysm Father   . Cancer Paternal Grandmother      ROS Review of Systems See HPI Constitution: No fevers or chills No malaise No diaphoresis Skin: No rash or itching Eyes: no blurry vision, no double vision GU: no dysuria or hematuria Neuro: no dizziness or headaches all others reviewed and negative   Objective: Vitals:   10/09/18 1218  BP:  111/75  Pulse: 64  Resp: 19  Temp: 98.3 F (36.8 C)  TempSrc: Oral  SpO2: 97%  Weight: 175 lb 3.2 oz (79.5 kg)  Height: 5' 11"  (1.803 m)    Physical Exam General: alert, oriented, in NAD Head: normocephalic, atraumatic, no sinus tenderness Eyes: EOM intact, no scleral icterus or conjunctival injection Ears: TM clear bilaterally with bulging TM Nose: mucosa nonerythematous, nonedematous Throat: no pharyngeal exudate or erythema Lymph: no posterior auricular, submental or cervical lymph adenopathy Heart: normal rate, normal sinus rhythm, no murmurs Lungs: clear to auscultation bilaterally, no wheezing   Assessment and Plan O[place] was seen today for ear fullness.  Diagnoses and all orders for this visit:  Dysfunction of both eustachian tubes -     fluticasone (FLONASE) 50 MCG/ACT nasal spray; Place 2 sprays into both nostrils daily.  -  No infection -  Advised flonase and zyrtec Pt has zyrtec at home    [place]

## 2018-11-27 ENCOUNTER — Other Ambulatory Visit: Payer: Self-pay | Admitting: Emergency Medicine

## 2018-12-17 ENCOUNTER — Ambulatory Visit: Payer: Commercial Managed Care - PPO | Admitting: Family Medicine

## 2018-12-27 ENCOUNTER — Encounter: Payer: Self-pay | Admitting: Family Medicine

## 2018-12-29 DIAGNOSIS — N898 Other specified noninflammatory disorders of vagina: Secondary | ICD-10-CM | POA: Diagnosis not present

## 2018-12-29 DIAGNOSIS — N760 Acute vaginitis: Secondary | ICD-10-CM | POA: Diagnosis not present

## 2019-01-20 ENCOUNTER — Ambulatory Visit: Payer: Commercial Managed Care - PPO | Admitting: Family Medicine

## 2019-03-15 ENCOUNTER — Encounter: Payer: Self-pay | Admitting: Family Medicine

## 2019-03-16 ENCOUNTER — Encounter: Payer: Self-pay | Admitting: Family Medicine

## 2019-03-16 ENCOUNTER — Ambulatory Visit (INDEPENDENT_AMBULATORY_CARE_PROVIDER_SITE_OTHER): Payer: Commercial Managed Care - PPO | Admitting: Family Medicine

## 2019-03-16 ENCOUNTER — Other Ambulatory Visit: Payer: Self-pay

## 2019-03-16 VITALS — BP 111/67 | HR 72 | Temp 99.0°F | Resp 16 | Ht 71.0 in | Wt 183.0 lb

## 2019-03-16 DIAGNOSIS — B360 Pityriasis versicolor: Secondary | ICD-10-CM

## 2019-03-16 DIAGNOSIS — B379 Candidiasis, unspecified: Secondary | ICD-10-CM | POA: Diagnosis not present

## 2019-03-16 NOTE — Patient Instructions (Signed)
° ° ° °  If you have lab work done today you will be contacted with your lab results within the next 2 weeks.  If you have not heard from us then please contact us. The fastest way to get your results is to register for My Chart. ° ° °IF you received an x-ray today, you will receive an invoice from [facility]. Please contact [facility] at [phone] with questions or concerns regarding your invoice.  ° °IF you received labwork today, you will receive an invoice from [facility]. Please contact [facility] at [phone] with questions or concerns regarding your invoice.  ° °Our billing staff will not be able to assist you with questions regarding bills from these companies. ° °You will be contacted with the lab results as soon as they are available. The fastest way to get your results is to activate your My Chart account. Instructions are located on the last page of this paperwork. If you have not heard from us regarding the results in 2 weeks, please contact this office. °  ° ° ° °

## 2019-03-17 ENCOUNTER — Encounter: Payer: Self-pay | Admitting: Family Medicine

## 2019-03-17 DIAGNOSIS — B379 Candidiasis, unspecified: Secondary | ICD-10-CM | POA: Insufficient documentation

## 2019-03-17 DIAGNOSIS — B360 Pityriasis versicolor: Secondary | ICD-10-CM | POA: Insufficient documentation

## 2019-03-17 MED ORDER — NYSTATIN-TRIAMCINOLONE 100000-0.1 UNIT/GM-% EX OINT: 1.0000 "application " | TOPICAL_OINTMENT | Freq: Two times a day (BID) | CUTANEOUS | 0 refills | Status: DC

## 2019-03-17 MED ORDER — SELENIUM SULFIDE 2.25 % EX SHAM: 1.0000 "application " | MEDICATED_SHAMPOO | Freq: Every day | CUTANEOUS | 0 refills | Status: DC

## 2019-03-17 NOTE — Progress Notes (Signed)
Acute Office Visit  Subjective:    Patient ID: Helen [name], female    DOB: 1971/01/16, 48 y.o.   MRN: [id number]  Chief Complaint  Patient presents with  . Rash    pt states she has a rash under her left arm x 2 weeks     HPI Patient is in today for rash noted on the back and underarms. Pt states episodically in the spring and summer. Pt states no itch.  Pt states eryth under the arm.  Rash has expanded to back and flank.  No pain  Past Medical History:  Diagnosis Date  . Alcohol abuse   . Allergy   . Crohn disease [facility])     Past Surgical History:  Procedure Laterality Date  . COLONOSCOPY      Family History  Problem Relation Age of Onset  . Mental illness Father   . Aneurysm Father   . Cancer Paternal Grandmother     Social History   Socioeconomic History  . Marital status: Single    Spouse name: Not on file  . Number of children: Not on file  . Years of education: Not on file  . Highest education level: Not on file  Occupational History  . Not on file  Social Needs  . Financial resource strain: Not on file  . Food insecurity:    Worry: Not on file    Inability: Not on file  . Transportation needs:    Medical: Not on file    Non-medical: Not on file  Tobacco Use  . Smoking status: Former Smoker    Types: Cigarettes    Start date: 01/23/1989    Last attempt to quit: 10/25/2013    Years since quitting: 5.3  . Smokeless tobacco: Current User  Substance and Sexual Activity  . Alcohol use: No    Alcohol/week: 0.0 standard drinks    Comment: former   . Drug use: No  . Sexual activity: Never    Birth control/protection: Abstinence  Lifestyle  . Physical activity:    Days per week: Not on file    Minutes per session: Not on file  . Stress: Not on file  Relationships  . Social connections:    Talks on phone: Not on file    Gets together: Not on file    Attends religious service: Not on file    Active member of club or organization: Not on file   Attends meetings of clubs or organizations: Not on file    Relationship status: Not on file  . Intimate partner violence:    Fear of current or ex partner: Not on file    Emotionally abused: Not on file    Physically abused: Not on file    Forced sexual activity: Not on file  Other Topics Concern  . Not on file  Social History Narrative  . Not on file    Outpatient Medications Prior to Visit  Medication Sig Dispense Refill  . buPROPion (WELLBUTRIN XL) 150 MG 24 hr tablet TAKE 1 TABLET(150 MG) BY MOUTH DAILY 90 tablet 1  . fluticasone (FLONASE) 50 MCG/ACT nasal spray Place 2 sprays into both nostrils daily. 16 g 6   No facility-administered medications prior to visit.     Allergies  Allergen Reactions  . Ginger Rash  . Other     Recovering alcoholic - prefers no narcotic's     ROS INTEG: persistent rash,  NO itching, new skin lesions- change in existing lesion-expanded for  axillary area to back     Objective:    Physical Exam  Constitutional: She appears well-developed and well-nourished.  Skin: Rash noted. There is erythema.  left axilla-eryth , edema-macule, hyperpigmented macules on the back and flank-left  BP 111/67   Pulse 72   Temp 99 F (37.2 C) (Oral)   Resp 16   Ht 5' 11"  (1.803 m)   Wt 183 lb (83 kg)   SpO2 97%   BMI 25.52 kg/m  Wt Readings from Last 3 Encounters:  03/16/19 183 lb (83 kg)  10/09/18 175 lb 3.2 oz (79.5 kg)  07/06/18 172 lb 3.2 oz (78.1 kg)    There are no preventive care reminders to display for this patient.  There are no preventive care reminders to display for this patient.   Lab Results  Component Value Date   TSH 2.870 05/21/2018   Lab Results  Component Value Date   WBC 4.7 05/21/2018   HGB 11.6 05/21/2018   HCT 35.4 05/21/2018   MCV 97 05/21/2018   PLT 173 05/21/2018   Lab Results  Component Value Date   NA 138 05/21/2018   K 4.4 05/21/2018   CO2 24 05/21/2018   GLUCOSE 77 05/21/2018   BUN 11 05/21/2018    CREATININE 0.71 05/21/2018   BILITOT 0.3 05/21/2018   ALKPHOS 34 (L) 05/21/2018   AST 15 05/21/2018   ALT 10 05/21/2018   PROT 6.2 05/21/2018   ALBUMIN 3.8 05/21/2018   CALCIUM 8.6 (L) 05/21/2018   ANIONGAP 7 08/31/2017   Lab Results  Component Value Date   CHOL 133 05/21/2018   Lab Results  Component Value Date   HDL 44 05/21/2018   Lab Results  Component Value Date   LDLCALC 81 05/21/2018   Lab Results  Component Value Date   TRIG 42 05/21/2018   Lab Results  Component Value Date   CHOLHDL 3.0 05/21/2018   Lab Results  Component Value Date   HGBA1C 5.0 05/21/2018       Assessment & Plan:  1. Tinea versicolor Selsun-rx-d/w pt   2. Candida infection   Helen Buist Hannah Beat, MD

## 2019-05-30 ENCOUNTER — Telehealth: Payer: Self-pay | Admitting: Family Medicine

## 2019-05-30 NOTE — Telephone Encounter (Signed)
Copied from [place] [id number]. Topic: Quick Communication - Rx Refill/Question >> May 30, 2019 10:08 AM Leward Quan A wrote: Medication: buPROPion (WELLBUTRIN XL) 150 MG 24 hr tablet   Per patient she is all out took last pill on 05/29/2019  Has the patient contacted their pharmacy? Yes  (Agent: If no, request that the patient contact the pharmacy for the refill.) (Agent: If yes, when and what did the pharmacy advise?)  Preferred Pharmacy (with phone number or street name): [facility] [phone] - [place], [place] - [address] [phone] (Phone) [phone] (Fax)    Agent: Please be advised that RX refills may take up to 3 business days. We ask that you follow-up with your pharmacy.

## 2019-05-31 ENCOUNTER — Other Ambulatory Visit: Payer: Self-pay | Admitting: *Deleted

## 2019-05-31 MED ORDER — BUPROPION HCL ER (XL) 150 MG PO TB24: ORAL_TABLET | ORAL | 0 refills | Status: DC

## 2019-06-01 ENCOUNTER — Telehealth: Payer: Self-pay | Admitting: Family Medicine

## 2019-06-01 NOTE — Telephone Encounter (Signed)
Helen [name] has spoken to pt.   Release is going to be mailed out.

## 2019-06-01 NOTE — Telephone Encounter (Signed)
Relation to pt: self  Call back number: [phone]   Reason for call:  Patient states Psychiatrist Dr. Modesta Messing at [place] [facility] phone # [phone] and fax [phone] requesting records regarding when patient started buPROPion (WELLBUTRIN XL) 150 MG 24 hr tablet and office notes supporting why medication was prescribed, patient was transferred over to [place] to give verbal okay to fax

## 2019-06-09 ENCOUNTER — Other Ambulatory Visit: Payer: Self-pay

## 2019-06-09 DIAGNOSIS — Z20822 Contact with and (suspected) exposure to covid-19: Secondary | ICD-10-CM

## 2019-06-10 LAB — SPECIMEN STATUS REPORT

## 2019-06-10 LAB — NOVEL CORONAVIRUS, NAA: SARS-CoV-2, NAA: NOT DETECTED

## 2019-06-30 ENCOUNTER — Encounter: Payer: Self-pay | Admitting: Family Medicine

## 2019-07-04 ENCOUNTER — Other Ambulatory Visit: Payer: Self-pay | Admitting: *Deleted

## 2019-07-04 MED ORDER — BUPROPION HCL ER (XL) 150 MG PO TB24: ORAL_TABLET | ORAL | 0 refills | Status: DC
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
## 2019-08-01 ENCOUNTER — Other Ambulatory Visit: Payer: Self-pay

## 2019-08-01 ENCOUNTER — Ambulatory Visit (INDEPENDENT_AMBULATORY_CARE_PROVIDER_SITE_OTHER): Payer: Commercial Managed Care - PPO | Admitting: Psychiatry

## 2019-08-01 ENCOUNTER — Encounter (HOSPITAL_COMMUNITY): Payer: Self-pay | Admitting: Psychiatry

## 2019-08-01 DIAGNOSIS — F1021 Alcohol dependence, in remission: Secondary | ICD-10-CM | POA: Diagnosis not present

## 2019-08-01 DIAGNOSIS — F330 Major depressive disorder, recurrent, mild: Secondary | ICD-10-CM | POA: Diagnosis not present

## 2019-08-01 NOTE — Patient Instructions (Signed)
1. Continue to hold bupropion 2. Next appointment: 10/29 at 10 AM

## 2019-08-17 ENCOUNTER — Ambulatory Visit: Payer: Commercial Managed Care - PPO

## 2019-08-24 NOTE — Progress Notes (Signed)
Virtual Visit via Video Note  I connected with Helen [name] on 09/01/19 at 10:00 AM EDT by a video enabled telemedicine application and verified that I am speaking with the correct person using two identifiers.   I discussed the limitations of evaluation and management by telemedicine and the availability of in person appointments. The patient expressed understanding and agreed to proceed.     I discussed the assessment and treatment plan with the patient. The patient was provided an opportunity to ask questions and all were answered. The patient agreed with the plan and demonstrated an understanding of the instructions.   The patient was advised to call back or seek an in-person evaluation if the symptoms worsen or if the condition fails to improve as anticipated.  I provided 5 minutes of non-face-to-face time during this encounter through video, and ten minutes on the phone   Helen Clay, MD     [facility] MD/PA/NP OP Progress Note  09/01/2019 2:26 PM Helen [name]  MRN:  [id number]  Chief Complaint:  Chief Complaint    Depression; Follow-up     HPI:  This is a follow-up appointment for depression.  She states that she has been doing well. She has been working on Loss adjuster, chartered projects as a Probation officer. She feels frustrated at work due to her pattern of taking responsibility on herself while working with others. She feels like she "cannot do enough" and "cannot make everyone happy," which she attributes to her low self esteem.  However, she now feels content to be single, and feels less lonely. She communicates with her ex-boyfriend, and reports they are respectful to each other. She celebrated 18 years of sobriety yesterday. She finds AA meeting to be very helpful for abstinence. She continues to go to love/sex addiction group. She denies insomnia. She denies feeling depressed. She has good concentration. She denies SI. She denies anxiety or panic attacks. She feels comfortable not starting  psychotropics at this time.   Visit Diagnosis:    ICD-10-CM   1. MDD (major depressive disorder), recurrent, in partial remission ([place])  F33.41     Past Psychiatric History: Please see initial evaluation for full details. I have reviewed the history. No updates at this time.     Past Medical History:  Past Medical History:  Diagnosis Date  . Alcohol abuse   . Allergy   . Crohn disease [facility])     Past Surgical History:  Procedure Laterality Date  . COLONOSCOPY      Family Psychiatric History: Please see initial evaluation for full details. I have reviewed the history. No updates at this time.     Family History:  Family History  Problem Relation Age of Onset  . Mental illness Father   . Aneurysm Father   . Alcohol abuse Father   . Drug abuse Father   . Depression Brother   . Cancer Paternal Grandmother   . Alcohol abuse Brother     Social History:  Social History   Socioeconomic History  . Marital status: Single    Spouse name: Not on file  . Number of children: Not on file  . Years of education: Not on file  . Highest education level: Not on file  Occupational History  . Not on file  Social Needs  . Financial resource strain: Not on file  . Food insecurity    Worry: Not on file    Inability: Not on file  . Transportation needs    Medical: Not on file  Non-medical: Not on file  Tobacco Use  . Smoking status: Former Smoker    Types: Cigarettes    Start date: 01/23/1989    Quit date: 10/25/2013    Years since quitting: 5.8  . Smokeless tobacco: Current User  Substance and Sexual Activity  . Alcohol use: No    Alcohol/week: 0.0 standard drinks    Comment: former   . Drug use: No  . Sexual activity: Never    Birth control/protection: Abstinence  Lifestyle  . Physical activity    Days per week: Not on file    Minutes per session: Not on file  . Stress: Not on file  Relationships  . Social Herbalist on phone: Not on file    Gets  together: Not on file    Attends religious service: Not on file    Active member of club or organization: Not on file    Attends meetings of clubs or organizations: Not on file    Relationship status: Not on file  Other Topics Concern  . Not on file  Social History Narrative  . Not on file    Allergies:  Allergies  Allergen Reactions  . Ginger Rash  . Other     Recovering alcoholic - prefers no narcotic's     Metabolic Disorder Labs: Lab Results  Component Value Date   HGBA1C 5.0 05/21/2018   No results found for: PROLACTIN Lab Results  Component Value Date   CHOL 133 05/21/2018   TRIG 42 05/21/2018   HDL 44 05/21/2018   CHOLHDL 3.0 05/21/2018   LDLCALC 81 05/21/2018   Lab Results  Component Value Date   TSH 2.870 05/21/2018   TSH 1.930 11/04/2017    Therapeutic Level Labs: No results found for: LITHIUM No results found for: VALPROATE No components found for:  CBMZ  Current Medications: Current Outpatient Medications  Medication Sig Dispense Refill  . fluticasone (FLONASE) 50 MCG/ACT nasal spray Place 2 sprays into both nostrils daily. 16 g 6  . nystatin-triamcinolone ointment (MYCOLOG) Apply 1 application topically 2 (two) times daily. To affected area 30 g 0  . Selenium Sulfide 2.25 % SHAM Apply 1 application topically at bedtime. Apply topically from neck to ankles every night for one week. Leave on skin for 13mn then wash off completely 1 Bottle 0   No current facility-administered medications for this visit.      Musculoskeletal: Strength & Muscle Tone: N/A Gait & Station: N/A Patient leans: N/A  Psychiatric Specialty Exam: Review of Systems  Psychiatric/Behavioral: Negative for depression, hallucinations, memory loss, substance abuse and suicidal ideas. The patient is not nervous/anxious and does not have insomnia.   All other systems reviewed and are negative.   There were no vitals taken for this visit.There is no height or weight on file to  calculate BMI.  General Appearance: Fairly Groomed  Eye Contact:  Good  Speech:  Clear and Coherent  Volume:  Normal  Mood:  "good"  Affect:  Appropriate, Congruent and euthymic, reactive  Thought Process:  Coherent  Orientation:  Full (Time, Place, and Person)  Thought Content: Logical   Suicidal Thoughts:  No  Homicidal Thoughts:  No  Memory:  Immediate;   Good  Judgement:  Good  Insight:  Good  Psychomotor Activity:  Normal  Concentration:  Concentration: Good and Attention Span: Good  Recall:  Good  Fund of Knowledge: Good  Language: Good  Akathisia:  No  Handed:  Right  AIMS (if  indicated): not done  Assets:  Communication Skills Desire for Improvement  ADL's:  Intact  Cognition: WNL  Sleep:  Good   Screenings: PHQ2-9     Office Visit from 03/16/2019 in Primary Care at [place] from 10/09/2018 in Primary Care at [facility] from 07/06/2018 in Primary Care at [place] from 05/21/2018 in [facility] [facility] from 02/19/2018 in Primary Care at [facility] Total Score  0  0  6  0  0  PHQ-9 Total Score  -  -  20  -  -       Assessment and Plan:  Shamonica Schadt is a 48 y.o. year old female with a history of depression, alcohol use disorder in sustained remission, crohn's disease , who presents for follow up appointment for MDD (major depressive disorder), recurrent, in partial remission ([place])  # MDD, recurrent in partial remission Exam is notable for good awareness into her mood symptoms/attachment patterns, and there has been overall improvement in depressive symptoms since the last visit. Psychosocial stressors includes work, conflict with her parents, and break up in July with a man, who abuses alcohol/drug. Given her mood symptoms are less in severity and has less interference in her life, she agrees not to start psychotropics at this time. Provided psycho education of relapse in her symptoms, and she is advised to contact the office  if any worsening in her symptoms.  She will continue to work with her therapist.   # Alcohol use disorder in sustained remission She has been abstinent for 18 years. She goes to [employer] regularly and has a sponsor.   Plan 1. No psychotropics are indicated at this time 2. Return to clinic as needed   The patient demonstrates the following risk factors for suicide: Chronic risk factors for suicide include: psychiatric disorder of depression and substance use disorder. Acute risk factors for suicide include: family or marital conflict. Protective factors for this patient include: coping skills and hope for the future. Considering these factors, the overall suicide risk at this point appears to be low. Patient is appropriate for outpatient follow up.  Helen Clay, MD 09/01/2019, 2:26 PM

## 2019-09-01 ENCOUNTER — Ambulatory Visit (INDEPENDENT_AMBULATORY_CARE_PROVIDER_SITE_OTHER): Payer: Commercial Managed Care - PPO | Admitting: Psychiatry

## 2019-09-01 ENCOUNTER — Encounter (HOSPITAL_COMMUNITY): Payer: Self-pay | Admitting: Psychiatry

## 2019-09-01 ENCOUNTER — Other Ambulatory Visit: Payer: Self-pay

## 2019-09-01 DIAGNOSIS — F3341 Major depressive disorder, recurrent, in partial remission: Secondary | ICD-10-CM | POA: Diagnosis not present

## 2019-09-01 NOTE — Patient Instructions (Signed)
1. No psychotropics are indicated at this time 2. Return to clinic as needed  3. I would recommend you continue to see your therapist regularly

## 2019-10-13 ENCOUNTER — Other Ambulatory Visit: Payer: Self-pay

## 2019-10-13 DIAGNOSIS — Z20822 Contact with and (suspected) exposure to covid-19: Secondary | ICD-10-CM

## 2019-10-15 LAB — NOVEL CORONAVIRUS, NAA: SARS-CoV-2, NAA: NOT DETECTED

## 2021-08-21 ENCOUNTER — Telehealth: Payer: Commercial Managed Care - PPO | Admitting: Nurse Practitioner

## 2021-08-21 DIAGNOSIS — U071 COVID-19: Secondary | ICD-10-CM | POA: Diagnosis not present

## 2021-08-21 MED ORDER — MOLNUPIRAVIR EUA 200MG CAPSULE: 4.0000 | ORAL_CAPSULE | Freq: Two times a day (BID) | ORAL | 0 refills | Status: AC

## 2021-08-21 NOTE — Progress Notes (Signed)
Virtual Visit Consent   Helen [name], you are scheduled for a virtual visit with Helen [name], [place], a [facility] provider, today.     Just as with appointments in the office, your consent must be obtained to participate.  Your consent will be active for this visit and any virtual visit you may have with one of our providers in the next 365 days.     If you have a MyChart account, a copy of this consent can be sent to you electronically.  All virtual visits are billed to your insurance company just like a traditional visit in the office.    As this is a virtual visit, video technology does not allow for your provider to perform a traditional examination.  This may limit your provider's ability to fully assess your condition.  If your provider identifies any concerns that need to be evaluated in person or the need to arrange testing (such as labs, EKG, etc.), we will make arrangements to do so.     Although advances in technology are sophisticated, we cannot ensure that it will always work on either your end or our end.  If the connection with a video visit is poor, the visit may have to be switched to a telephone visit.  With either a video or telephone visit, we are not always able to ensure that we have a secure connection.     I need to obtain your verbal consent now.   Are you willing to proceed with your visit today? YES   Helen [name] has provided verbal consent on 08/21/2021 for a virtual visit (video or telephone).   Helen Hassell Done, FNP   Date: 08/21/2021 2:51 PM   Virtual Visit via Video Note   I, Helen [name], connected with Helen [name] ([id number], 1971-10-09) on 08/21/21 at  3:00 PM EDT by a video-enabled telemedicine application and verified that I am speaking with the correct person using two identifiers.  Location: Patient: Virtual Visit Location Patient: Home Provider: Virtual Visit Location Provider: Mobile   I discussed the limitations of  evaluation and management by telemedicine and the availability of in person appointments. The patient expressed understanding and agreed to proceed.    History of Present Illness: Helen [name] is a 50 y.o. who identifies as a female who was assigned female at birth, and is being seen today for covid positive .  HPI: Patient has been traveling. This past friday she started with slight ear pain and swollen lymph node. Progressed to congestion. She flew  home on Monday and was very achy on plane with dry cough and headache. She stayed in bed all day yesterday with achiness. She tested positive today for covid. She really does not feel that bad today but does still have congestion.   Problems:  Patient Active Problem List   Diagnosis Date Noted   Tinea versicolor 03/17/2019   Candida infection 03/17/2019   Major depressive disorder 07/06/2018   Crohn's disease with abscess ([place])    Bowel obstruction ([place]) 08/26/2017   History of ETOH abuse 08/26/2017    Allergies:  Allergies  Allergen Reactions   Ginger Rash   Other     Recovering alcoholic - prefers no narcotic's    Medications:  Current Outpatient Medications:    fluticasone (FLONASE) 50 MCG/ACT nasal spray, Place 2 sprays into both nostrils daily., Disp: 16 g, Rfl: 6   nystatin-triamcinolone ointment (MYCOLOG), Apply 1 application topically 2 (two) times daily. To affected area, Disp: 30  g, Rfl: 0   Selenium Sulfide 2.25 % SHAM, Apply 1 application topically at bedtime. Apply topically from neck to ankles every night for one week. Leave on skin for 63mn then wash off completely, Disp: 1 Bottle, Rfl: 0  Observations/Objective: Patient is well-developed, well-nourished in no acute distress.  Resting comfortably  at home.  Head is normocephalic, atraumatic.  No labored breathing.  Speech is clear and coherent with logical content.  Patient is alert and oriented at baseline.    Assessment and Plan:  MKendell [name] today with  chief complaint of No chief complaint on file.   1. Lab test positive for detection of COVID-19 virus 1. Take meds as prescribed 2. Use a cool mist humidifier especially during the winter months and when heat has been humid. 3. Use saline nose sprays frequently 4. Saline irrigations of the nose can be very helpful if [name] frequently.  * 4X daily for 1 week*  * Use of a nettie pot can be helpful with this. Follow directions with this* 5. Drink plenty of fluids 6. Keep thermostat turn down low 7.For any cough or congestion  Use plain Mucinex- regular strength or max strength is fine   * Children- consult with Pharmacist for dosing 8. For fever or aces or pains- take tylenol or ibuprofen appropriate for age and weight.  * for fevers greater than 101 orally you may alternate ibuprofen and tylenol every  3 hours.    Meds ordered this encounter  Medications   molnupiravir EUA (LAGEVRIO) 200 mg CAPS capsule    Sig: Take 4 capsules (800 mg total) by mouth 2 (two) times daily for 5 days.    Dispense:  40 capsule    Refill:  0    Order Specific Question:   Supervising Provider    Answer:   MNoemi [name][[id number]]      Follow Up Instructions: I discussed the assessment and treatment plan with the patient. The patient was provided an opportunity to ask questions and all were answered. The patient agreed with the plan and demonstrated an understanding of the instructions.  A copy of instructions were sent to the patient via MyChart.  The patient was advised to call back or seek an in-person evaluation if the symptoms worsen or if the condition fails to improve as anticipated.  Time:  I spent 11 minutes with the patient via telehealth technology discussing the above problems/concerns.    Helen MHassell Done FNP

## 2021-08-21 NOTE — Patient Instructions (Signed)
You are being prescribed MOLNUPIRAVIR for COVID-19 infection.  ° ° °Please call the pharmacy or go through the drive through vs going inside if you are picking up the mediation yourself to prevent further spread. If prescribed to a [place] affiliated pharmacy, a pharmacist will bring the medication out to your car. ° ° °ADMINISTRATION INSTRUCTIONS: °Take with or without food. Swallow the tablets whole. Don't chew, crush, or break the medications because it might not work as well ° °For each dose of the medication, you should be taking FOUR tablets at one time, TWICE a day  ° °Finish your full five-day course of Molnupiravir even if you feel better before you're done. Stopping this medication too early can make it less effective to prevent severe illness related to COVID19.   ° °Molnupiravir is prescribed for YOU ONLY. Don't share it with others, even if they have similar symptoms as you. This medication might not be right for everyone.  ° °Make sure to take steps to protect yourself and others while you're taking this medication in order to get well soon and to prevent others from getting sick with COVID-19. ° ° °**If you are of childbearing potential (any gender) - it is advised to not get pregnant while taking this medication and recommended that condoms are used for female partners the next 3 months after taking the medication out of extreme caution  ° ° °COMMON SIDE EFFECTS: °Diarrhea °Nausea  °Dizziness ° ° ° °If your COVID-19 symptoms get worse, get medical help right away. Call 911 if you experience symptoms such as worsening cough, trouble breathing, chest pain that doesn't go away, confusion, a hard time staying awake, and pale or blue-colored skin. °This medication won't prevent all COVID-19 cases from getting worse.  ° ° °

## 2024-03-04 ENCOUNTER — Other Ambulatory Visit (INDEPENDENT_AMBULATORY_CARE_PROVIDER_SITE_OTHER)

## 2024-03-04 ENCOUNTER — Encounter: Payer: Self-pay | Admitting: Nurse Practitioner

## 2024-03-04 ENCOUNTER — Ambulatory Visit (INDEPENDENT_AMBULATORY_CARE_PROVIDER_SITE_OTHER): Admitting: Nurse Practitioner

## 2024-03-04 VITALS — BP 110/72 | HR 69 | Ht 71.0 in | Wt 184.0 lb

## 2024-03-04 DIAGNOSIS — K602 Anal fissure, unspecified: Secondary | ICD-10-CM

## 2024-03-04 DIAGNOSIS — K5080 Crohn's disease of both small and large intestine without complications: Secondary | ICD-10-CM

## 2024-03-04 DIAGNOSIS — K50919 Crohn's disease, unspecified, with unspecified complications: Secondary | ICD-10-CM

## 2024-03-04 LAB — COMPREHENSIVE METABOLIC PANEL WITH GFR
ALT: 12 U/L (ref 0–35)
AST: 16 U/L (ref 0–37)
Albumin: 4.3 g/dL (ref 3.5–5.2)
Alkaline Phosphatase: 45 U/L (ref 39–117)
BUN: 12 mg/dL (ref 6–23)
CO2: 28 meq/L (ref 19–32)
Calcium: 9.1 mg/dL (ref 8.4–10.5)
Chloride: 104 meq/L (ref 96–112)
Creatinine, Ser: 0.83 mg/dL (ref 0.40–1.20)
GFR: 82.05 mL/min (ref >60.00)
Glucose, Bld: 92 mg/dL (ref 70–99)
Potassium: 4.4 meq/L (ref 3.5–5.1)
Sodium: 137 meq/L (ref 135–145)
Total Bilirubin: 0.3 mg/dL (ref 0.2–1.2)
Total Protein: 6.9 g/dL (ref 6.0–8.3)

## 2024-03-04 LAB — C-REACTIVE PROTEIN: CRP: <1 mg/dL (ref 0.5–20.0)

## 2024-03-04 LAB — VITAMIN D 25 HYDROXY (VIT D DEFICIENCY, FRACTURES): VITD: 34.63 ng/mL (ref 30.00–100.00)

## 2024-03-04 LAB — SEDIMENTATION RATE: Sed Rate: 4 mm/h (ref 0–30)

## 2024-03-04 MED ORDER — NA SULFATE-K SULFATE-MG SULF 17.5-3.13-1.6 GM/177ML PO SOLN: 1.0000 | Freq: Once | ORAL | 0 refills | Status: AC

## 2024-03-04 NOTE — Progress Notes (Unsigned)
 03/04/2024 Helen [name] [id number] 1971/01/25   CHIEF COMPLAINT: Crohn's disease   HISTORY OF PRESENT ILLNESS: Helen [name] is a 53 year old female with a past medical history of anxiety, depression, alcohol use disorder (abstinent x 23 years), anal fissure and Crohn's disease of both small and large intestine initially diagnosed in 1991 treated with Asacol x 5 or 6 years and subsequent developed a small bowel obstruction at the ileocecal valve s/p ileocecectomy 10/07/2017 by Dr. Garnetta Justin who resected a short segment of the IC valve where her Crohn's disease caused a short fibrotic stricture. She was previously followed by Dr. Howard Macho who retired therefore she presents to our office today to establish a new GI, referred to Dr. Bridgett Camps per colorectal surgery PA-C. She stated her last colonoscopy was done in 2019, several months after her ileocecectomy surgery and she was advised by Dr. Andriette Keeling to repeat a colonoscopy in 10 years. She has not been seen by Dr. Andriette Keeling since completing the colonoscopy in 2019. She has a history of an anal fissure associated with past constipation followed by colorectal surgery. It is unclear at this time if her anal fissure is a manifestation of anorectal Crohn's disease or simply a fissure from past constipation. She had recurrent rectal pain and bleeding 10/2023, stress level was elevated at that time with aging parents and going through perimenopause. Seen by colorectal surgery 01/19/2024, prescribed Diltiazem fissure ointment, referred to pelvic floor physical therapy and possible botox injections considered if her anal fissure did not resolve. She passes a normal formed brown stool once daily, sometimes skips a day. She often has abdominal bloat. She takes 3 probiotics. No known family history of IBD or colorectal cancer.   Past Medical History:  Diagnosis Date   Alcohol abuse    Allergy    Crohn disease ([facility])    Past Surgical History:  Procedure  Laterality Date   COLONOSCOPY     Social History: She is single. She is a Architect. Past smoker, stopped smoking cigarettes 3 years ago. Abstinent from alcohol x 23 years. Past marijuana use.   Family History: No known family history of IBD or colorectal cancer. Father with depression and alcohol use disorder. Brother with alcohol use disorder.    Allergies  Allergen Reactions   Ginger Rash   Other     Recovering alcoholic - prefers no narcotic's       Outpatient Encounter Medications as of 03/04/2024  Medication Sig   estrogens, conjugated, (PREMARIN) 0.3 MG tablet Take 0.3 mg by mouth daily. Take daily for 21 days then do not take for 7 days.   FLUoxetine (PROZAC) 40 MG capsule Take 40 mg by mouth daily.   PROGESTERONE  PO    fluticasone  (FLONASE ) 50 MCG/ACT nasal spray Place 2 sprays into both nostrils daily.   nystatin -triamcinolone  ointment (MYCOLOG) Apply 1 application topically 2 (two) times daily. To affected area   Selenium  Sulfide 2.25 % SHAM Apply 1 application topically at bedtime. Apply topically from neck to ankles every night for one week. Leave on skin for then wash off completely   No facility-administered encounter medications on file as of 03/04/2024.   REVIEW OF SYSTEMS:  Gen: + Fatigue. Denies fever, sweats or chills. No weight loss.  CV: Denies chest pain, palpitations or edema. Resp: Denies cough, shortness of breath of hemoptysis.  GI: See HPI. No GERD symptoms.  GU: Denies urinary burning, blood in urine, increased urinary frequency or incontinence. MS:  Muscle cramps.  Derm: Denies rash, itchiness, skin lesions or unhealing ulcers. Psych: + Anxiety and depression. Heme: Denies bruising, easy bleeding. Neuro:  Denies headaches, dizziness or paresthesias. Endo:  Denies any problems with DM, thyroid  or adrenal function.  PHYSICAL EXAM: BP 110/72   Pulse 69   Ht 5\' 11"  (1.803 m)   Wt 184 lb (83.5 kg)   BMI 25.66 kg/m  General: 53 year old  female in no acute distress. Head: Normocephalic and atraumatic. Eyes:  Sclerae non-icteric, conjunctive pink. Ears: Normal auditory acuity. Mouth: Dentition intact. No ulcers or lesions.  Neck: Supple, no lymphadenopathy or thyromegaly.  Lungs: Clear bilaterally to auscultation without wheezes, crackles or rhonchi. Heart: Regular rate and rhythm. No murmur, rub or gallop appreciated.  Abdomen: Soft, nontender, nondistended. No masses. No hepatosplenomegaly. Normoactive bowel sounds x 4 quadrants.  Rectal: Small posterior fissure without active bleeding or exudate, very mild tenderness. Small thickened anterior anal fold. Tight anal sphincter. Shell CMA present during exam.  Musculoskeletal: Symmetrical with no gross deformities. Skin: Warm and dry. No rash or lesions on visible extremities. Extremities: No edema. Neurological: Alert oriented x 4, no focal deficits.  Psychological:  Alert and cooperative. Normal mood and affect.  ASSESSMENT AND PLAN:  53 year old female with Crohn's disease of both small and large intestine initially diagnosed in 1991 treated with Asacol x 5 or 6 years then off treatment and subsequently developed a small bowel obstruction at the ileocecal valve secondary to a short fibrotic stricture s/p ileocecectomy 10/07/2017. Last colonoscopy by Dr. Andriette Keeling was 04/2018, results not found in care everywhere. -Request copy of colonoscopy procedure and biopsy results 04/2018 by Dr. Andriette Keeling -Colonoscopy to assess for active Crohn's disease and colon polyps, benefits and risks discussed including risk with sedation, risk of bleeding, perforation and infection  -CBC, CMP, CRP, Sed rate and vitamin D  level -No NSAIDS  Posterior anal fissure, treated with Diltiazem fissure ointment per colorectal surgery. Anal fissure possibly a manifestation of anorectal Crohn's disease vs uncomplicated anal fissure from past constipation. (Crohn's related fissures are typically in the right or left  lateral anal area and less often to the posterior anal area).  -Colonoscopy as ordered above, to rule our evidence of anorectal Crohn's disease -Continue Diltiazem fissure ointment as prescribed, patient instructed to use tid daily x 6 weeks  -Miralax  as needed, avoid constipation     CC:  Genaro Kempf, F*

## 2024-03-04 NOTE — Patient Instructions (Addendum)
 You have been scheduled for a colonoscopy. Please follow written instructions given to you at your visit today.   If you use inhalers (even only as needed), please bring them with you on the day of your procedure. _________________________________________________________________________  Your provider has requested that you go to the basement level for lab work before leaving today. Press "B" on the elevator. The lab is located at the first door on the left as you exit the elevator  NO NSAIDS . (Tylenol , Ibuprofen, etc)  Due to recent changes in healthcare laws, you may see the results of your imaging and laboratory studies on MyChart before your provider has had a chance to review them.  We understand that in some cases there may be results that are confusing or concerning to you. Not all laboratory results come back in the same time frame and the provider may be waiting for multiple results in order to interpret others.  Please give us  48 hours in order for your provider to thoroughly review all the results before contacting the office for clarification of your results.   Thank you for trusting me with your gastrointestinal care!   Everett Hitt, CRNP

## 2024-03-05 LAB — CBC WITH DIFFERENTIAL/PLATELET
Basophils Absolute: 0 10*3/uL (ref 0.0–0.1)
Basophils Relative: 0.5 % (ref 0.0–3.0)
Eosinophils Absolute: 0.1 10*3/uL (ref 0.0–0.7)
Eosinophils Relative: 1.1 % (ref 0.0–5.0)
HCT: 39.2 % (ref 36.0–46.0)
Hemoglobin: 13.2 g/dL (ref 12.0–15.0)
Lymphocytes Relative: 21.6 % (ref 12.0–46.0)
Lymphs Abs: 1.5 10*3/uL (ref 0.7–4.0)
MCHC: 33.6 g/dL (ref 30.0–36.0)
MCV: 96 fl (ref 78.0–100.0)
Monocytes Absolute: 0.6 10*3/uL (ref 0.1–1.0)
Monocytes Relative: 7.9 % (ref 3.0–12.0)
Neutro Abs: 4.9 10*3/uL (ref 1.4–7.7)
Neutrophils Relative %: 68.9 % (ref 43.0–77.0)
Platelets: 203 10*3/uL (ref 150.0–400.0)
RBC: 4.08 Mil/uL (ref 3.87–5.11)
RDW: 13.6 % (ref 11.5–15.5)
WBC: 7.1 10*3/uL (ref 4.0–10.5)

## 2024-03-11 NOTE — Telephone Encounter (Signed)
 Contacted patient and patient verbalized understanding of normal lab results.

## 2024-03-11 NOTE — Telephone Encounter (Signed)
 Patient returning call to you. Please advise/.

## 2024-03-24 NOTE — Progress Notes (Signed)
 Addendum: Reviewed and agree with assessment and management plan. Asha Grumbine, Carie Caddy, MD

## 2024-05-04 ENCOUNTER — Encounter: Admitting: Internal Medicine

## 2024-05-09 ENCOUNTER — Encounter: Payer: Self-pay | Admitting: Internal Medicine

## 2024-05-16 ENCOUNTER — Encounter: Admitting: Internal Medicine

## 2024-07-01 ENCOUNTER — Telehealth: Payer: Self-pay | Admitting: Internal Medicine

## 2024-07-01 MED ORDER — NA SULFATE-K SULFATE-MG SULF 17.5-3.13-1.6 GM/177ML PO SOLN: ORAL | 0 refills | Status: DC

## 2024-07-01 NOTE — Telephone Encounter (Signed)
 Receiving call from patient stating she needs help accessing mychart and prep instructions, requesting nurse f/u for in depth discuss, set to see pt 9/3. Please review and advise  Thank you

## 2024-07-01 NOTE — Telephone Encounter (Signed)
 Patient needed technical support to access mychart. She had mychart through other modalities but did not have access through [facility]. She was able to add [place] to her MyChart and access updated prep instructions while she was on the phone with me. She also asked that Suprep be sent back in to her pharmacy. Rx sent as requested. Patient had no additional questions at the end of our call.

## 2024-07-06 ENCOUNTER — Ambulatory Visit: Admitting: Internal Medicine

## 2024-07-06 ENCOUNTER — Encounter: Payer: Self-pay | Admitting: Internal Medicine

## 2024-07-06 VITALS — BP 114/62 | HR 54 | Temp 98.1°F | Resp 11 | Ht 71.0 in | Wt 184.0 lb

## 2024-07-06 DIAGNOSIS — K50919 Crohn's disease, unspecified, with unspecified complications: Secondary | ICD-10-CM

## 2024-07-06 DIAGNOSIS — K5000 Crohn's disease of small intestine without complications: Secondary | ICD-10-CM | POA: Diagnosis present

## 2024-07-06 MED ORDER — SODIUM CHLORIDE 0.9 % IV SOLN: 500.0000 mL | Freq: Once | INTRAVENOUS | Status: DC

## 2024-07-06 NOTE — Op Note (Signed)
 [facility] Patient Name: Helen [name] Procedure Date: 07/06/2024 2:41 PM MRN: [id number] Endoscopist: Gordy CHRISTELLA Starch , MD, [id number] Age: 53 Referring MD:  Date of Birth: 01-21-1971 Gender: Female Account #: [id number] Procedure:                Colonoscopy Indications:              Disease activity assessment of Crohn's disease of                            the small bowel; History of ileocecectomy December                            2018 for distal ileal stricture; Crohn's disease                            initially diagnosed 1991 Medicines:                Monitored Anesthesia Care Procedure:                Pre-Anesthesia Assessment:                           - Prior to the procedure, a History and Physical                            was performed, and patient medications and                            allergies were reviewed. The patient's tolerance of                            previous anesthesia was also reviewed. The risks                            and benefits of the procedure and the sedation                            options and risks were discussed with the patient.                            All questions were answered, and informed consent                            was obtained. Prior Anticoagulants: The patient has                            taken no anticoagulant or antiplatelet agents. ASA                            Grade Assessment: II - A patient with mild systemic                            disease. After reviewing the risks and benefits,  the patient was deemed in satisfactory condition to                            undergo the procedure.                           After obtaining informed consent, the colonoscope                            was passed under direct vision. Throughout the                            procedure, the patient's blood pressure, pulse, and                            oxygen saturations were monitored  continuously. The                            Olympus Scope SN: [id number] was introduced through                            the anus with the intention of advancing to the                            cecum. The scope was advanced to the sigmoid colon                            before the procedure was aborted. Medications were                            given. The colonoscopy was performed without                            difficulty. The patient tolerated the procedure                            well. The quality of the bowel preparation was poor                            and inadequate. Scope In: 3:12:13 PM Scope Out: 3:13:57 PM Total Procedure Duration: 0 hours 1 minute 44 seconds  Findings:                 The digital rectal exam was normal.                           A large amount of semi-solid stool was found in the                            rectum and in the sigmoid colon, interfering with                            visualization. Complications:            No immediate complications. Estimated Blood  Loss:     Estimated blood loss: none. Impression:               - Preparation of the colon was poor.                           - Stool in the rectum and in the sigmoid colon.                           - No specimens collected. Recommendation:           - Patient has a contact number available for                            emergencies. The signs and symptoms of potential                            delayed complications were discussed with the                            patient. Return to normal activities tomorrow.                            Written discharge instructions were provided to the                            patient.                           - Clear liquid diet and repeat colonoscopy tomorrow                            after additional bowel prep.                           - After colonoscopy continue daily Benefiber but                            add MiraLAX  17 g daily. If  constipation persist                            despite daily MiraLAX  and we could consider trial                            of Linzess or Amitiza for chronic constipation. Gordy CHRISTELLA Starch, MD 07/06/2024 3:22:12 PM This report has been signed electronically.

## 2024-07-06 NOTE — Progress Notes (Unsigned)
 Report to PACU, RN, vss, BBS= Clear.

## 2024-07-06 NOTE — Patient Instructions (Addendum)
 - Clear liquid diet and repeat colonoscopy tomorrow     after additional bowel prep.  - After colonoscopy continue daily Benefiber but      add MiraLAX  17 g daily. If constipation persist      despite daily MiraLAX  and we could consider trial      of Linzess or Amitiza for chronic constipation. - Repeat colonoscopy tomorrow d/t poor prep.  Arrive at 730 am.  YOU HAD AN ENDOSCOPIC PROCEDURE TODAY AT THE [facility]:   Refer to the procedure report that was given to you for any specific questions about what was found during the examination.  If the procedure report does not answer your questions, please call your gastroenterologist to clarify.  If you requested that your care partner not be given the details of your procedure findings, then the procedure report has been included in a sealed envelope for you to review at your convenience later.  YOU SHOULD EXPECT: Some feelings of bloating in the abdomen. Passage of more gas than usual.  Walking can help get rid of the air that was put into your GI tract during the procedure and reduce the bloating. If you had a lower endoscopy (such as a colonoscopy or flexible sigmoidoscopy) you may notice spotting of blood in your stool or on the toilet paper. If you underwent a bowel prep for your procedure, you may not have a normal bowel movement for a few days.  Please Note:  You might notice some irritation and congestion in your nose or some drainage.  This is from the oxygen used during your procedure.  There is no need for concern and it should clear up in a day or so.  SYMPTOMS TO REPORT IMMEDIATELY:  Following lower endoscopy (colonoscopy or flexible sigmoidoscopy):  Excessive amounts of blood in the stool  Significant tenderness or worsening of abdominal pains  Swelling of the abdomen that is new, acute  Fever of 100F or higher   For urgent or emergent issues, a gastroenterologist can be reached at any hour by calling [phone]  [phone]. Do not use MyChart messaging for urgent concerns.    DIET:  We do recommend a small meal at first, but then you may proceed to your regular diet.  Drink plenty of fluids but you should avoid alcoholic beverages for 24 hours.  ACTIVITY:  You should plan to take it easy for the rest of today and you should NOT DRIVE or use heavy machinery until tomorrow (because of the sedation medicines used during the test).    FOLLOW UP: Our staff will call the number listed on your records the next business day following your procedure.  We will call around 7:15- 8:00 am to check on you and address any questions or concerns that you may have regarding the information given to you following your procedure. If we do not reach you, we will leave a message.     If any biopsies were taken you will be contacted by phone or by letter within the next 1-3 weeks.  Please call us  at [phone] if you have not heard about the biopsies in 3 weeks.    SIGNATURES/CONFIDENTIALITY: You and/or your care partner have signed paperwork which will be entered into your electronic medical record.  These signatures attest to the fact that that the information above on your After Visit Summary has been reviewed and is understood.  Full responsibility of the confidentiality of this discharge information lies with you and/or your  care-partner.

## 2024-07-06 NOTE — Progress Notes (Unsigned)
 GASTROENTEROLOGY PROCEDURE H&P NOTE   Primary Care Physician: Stallings, Zoe A, MD (Inactive)    Reason for Procedure:  History of ileal Crohn's disease status post ileocecectomy  Plan:    Colonoscopy  Patient is appropriate for endoscopic procedure(s) in the ambulatory ([facility]) setting.  The nature of the procedure, as well as the risks, benefits, and alternatives were carefully and thoroughly reviewed with the patient. Ample time for discussion and questions allowed. The patient understood, was satisfied, and agreed to proceed.     HPI: Helen [name] is a 53 y.o. female who presents for colonoscopy.  Medical history as below.  Tolerated the prep.  No recent chest pain or shortness of breath.  No abdominal pain today.  Does not feel with chronic constipation and uses Benefiber but with incomplete results  Past Medical History:  Diagnosis Date   Alcohol abuse    Allergy    Crohn disease (HCC)     Past Surgical History:  Procedure Laterality Date   COLONOSCOPY     LAPAROSCOPIC ILEOCECECTOMY  2018    Prior to Admission medications   Medication Sig Start Date End Date Taking? Authorizing Provider  Cyanocobalamin 1000 MCG TBCR Take 1 capsule by mouth daily. 08/16/20  Yes [provider]  estrogens, conjugated, (PREMARIN) 0.3 MG tablet Take 0.3 mg by mouth daily. Take daily for 21 days then do not take for 7 days.   Yes [provider]  FLUoxetine (PROZAC) 40 MG capsule Take 40 mg by mouth daily.   Yes [provider]  ketoconazole (NIZORAL) 2 % shampoo Apply to damp scalp, lather, leave x 5 minutes, then rinse out. Repeat 3x/week.   Yes [provider]  NON FORMULARY TESTOSTERONE  CYPIONATE 200 MG/ML INTRAMUSCULAR OIL   Yes [provider]  NON FORMULARY dilTIAZem (CARDIZEM) 2 % anal ointment   Yes [provider]  Probiotic Product (PROBIOTIC BLEND PO)    Yes [provider]  PROGESTERONE  PO    Yes [provider]  Turmeric 400 MG CAPS Take 1 capsule by mouth daily. 08/16/20  Yes [provider]  Zinc 20 MG CAPS    Yes [provider]    Current Outpatient Medications  Medication Sig Dispense Refill   Cyanocobalamin 1000 MCG TBCR Take 1 capsule by mouth daily.     estrogens, conjugated, (PREMARIN) 0.3 MG tablet Take 0.3 mg by mouth daily. Take daily for 21 days then do not take for 7 days.     FLUoxetine (PROZAC) 40 MG capsule Take 40 mg by mouth daily.     ketoconazole (NIZORAL) 2 % shampoo Apply to damp scalp, lather, leave x 5 minutes, then rinse out. Repeat 3x/week.     NON FORMULARY TESTOSTERONE  CYPIONATE 200 MG/ML INTRAMUSCULAR OIL     NON FORMULARY dilTIAZem (CARDIZEM) 2 % anal ointment     Probiotic Product (PROBIOTIC BLEND PO)      PROGESTERONE  PO      Turmeric 400 MG CAPS Take 1 capsule by mouth daily.     Zinc 20 MG CAPS      Current Facility-Administered Medications  Medication Dose Route Frequency Provider Last Rate Last Admin   0.9 %  sodium chloride  infusion  500 mL Intravenous Once Argie Lober, Gordy HERO, MD        Allergies as of 07/06/2024 - Review Complete 07/06/2024  Allergen Reaction Noted   Ginger Rash 11/08/2017   Other Other (See Comments) 08/26/2017    Family History  Problem  Relation Age of Onset   Mental illness Father    Aneurysm Father    Alcohol abuse Father    Drug abuse Father    Depression Brother    Alcohol abuse Brother    Stomach cancer Maternal Grandfather    Stomach cancer Paternal Grandmother    Cancer Paternal Grandmother    Colon cancer Neg Hx    Esophageal cancer Neg Hx    Rectal cancer Neg Hx     Social History   Socioeconomic History   Marital status: Single    Spouse name: Not on file   Number of children: 0   Years of education: Not on file   Highest education level: Not on file  Occupational History   Occupation: Architect  Tobacco Use   Smoking status: Former    Current packs/day: 0.00     Types: Cigarettes    Start date: 01/23/1989    Quit date: 10/25/2013    Years since quitting: 10.7   Smokeless tobacco: Current  Vaping Use   Vaping status: Never Used  Substance and Sexual Activity   Alcohol use: No    Alcohol/week: 0.0 standard drinks of alcohol    Comment: former    Drug use: No   Sexual activity: Never    Birth control/protection: Abstinence  Other Topics Concern   Not on file  Social History Narrative   Not on file   Social Drivers of Health   Financial Resource Strain: Not on file  Food Insecurity: Not on file  Transportation Needs: Not on file  Physical Activity: Not on file  Stress: Not on file  Social Connections: Not on file  Intimate Partner Violence: Not on file    Physical Exam: Vital signs in last 24 hours: @BP  124/78   Pulse 65   Temp 98.1 F (36.7 C) (Skin)   Resp 14   Ht 5' 11 (1.803 m)   Wt 184 lb (83.5 kg)   SpO2 98%   BMI 25.66 kg/m  GEN: NAD EYE: Sclerae anicteric ENT: MMM CV: Non-tachycardic Pulm: CTA b/l GI: Soft, NT/ND NEURO:  Alert & Oriented x 3   Gordy Starch, MD [facility]  07/06/2024 3:09 PM

## 2024-07-07 ENCOUNTER — Encounter: Payer: Self-pay | Admitting: Internal Medicine

## 2024-07-07 ENCOUNTER — Telehealth: Payer: Self-pay

## 2024-07-07 ENCOUNTER — Ambulatory Visit (AMBULATORY_SURGERY_CENTER): Admitting: Internal Medicine

## 2024-07-07 VITALS — BP 105/61 | HR 57 | Temp 97.9°F | Resp 16 | Ht 71.0 in | Wt 184.0 lb

## 2024-07-07 DIAGNOSIS — K529 Noninfective gastroenteritis and colitis, unspecified: Secondary | ICD-10-CM

## 2024-07-07 DIAGNOSIS — D123 Benign neoplasm of transverse colon: Secondary | ICD-10-CM | POA: Diagnosis present

## 2024-07-07 DIAGNOSIS — K50919 Crohn's disease, unspecified, with unspecified complications: Secondary | ICD-10-CM

## 2024-07-07 DIAGNOSIS — K648 Other hemorrhoids: Secondary | ICD-10-CM | POA: Diagnosis not present

## 2024-07-07 DIAGNOSIS — K5080 Crohn's disease of both small and large intestine without complications: Secondary | ICD-10-CM

## 2024-07-07 DIAGNOSIS — K6389 Other specified diseases of intestine: Secondary | ICD-10-CM | POA: Diagnosis not present

## 2024-07-07 DIAGNOSIS — K602 Anal fissure, unspecified: Secondary | ICD-10-CM

## 2024-07-07 DIAGNOSIS — K5100 Ulcerative (chronic) pancolitis without complications: Secondary | ICD-10-CM | POA: Diagnosis not present

## 2024-07-07 MED ORDER — SODIUM CHLORIDE 0.9 % IV SOLN: 500.0000 mL | Freq: Once | INTRAVENOUS | Status: DC

## 2024-07-07 NOTE — Progress Notes (Signed)
 Called to room to assist during endoscopic procedure.  Patient ID and intended procedure confirmed with present staff. Received instructions for my participation in the procedure from the performing physician.

## 2024-07-07 NOTE — Telephone Encounter (Signed)
 Patient rescheduled for this morning due to poor prep. Patient doing well and in route to facility.

## 2024-07-07 NOTE — Progress Notes (Signed)
 GASTROENTEROLOGY PROCEDURE H&P NOTE   Primary Care Physician: Jarold Omar RAMAN, MD    Reason for Procedure:   Ileocolonic Crohn's disease s/p ileocecectomy 10/2017  Plan:    Colonoscopy  Patient is appropriate for endoscopic procedure(s) in the ambulatory ([facility]) setting.  The nature of the procedure, as well as the risks, benefits, and alternatives were carefully and thoroughly reviewed with the patient. Ample time for discussion and questions allowed. The patient understood, was satisfied, and agreed to proceed.     HPI: Helen [name] is a 53 y.o. female who presents for colonoscopy for evaluation of Crohn's disease .  Patient was most recently seen in the Gastroenterology Clinic on 03/04/24.  No interval change in medical history since that appointment. Please refer to that note for full details regarding GI history and clinical presentation.   Past Medical History:  Diagnosis Date   Alcohol abuse    Allergy    Crohn disease (HCC)     Past Surgical History:  Procedure Laterality Date   COLONOSCOPY     LAPAROSCOPIC ILEOCECECTOMY  2018    Prior to Admission medications   Medication Sig Start Date End Date Taking? Authorizing Provider  Cyanocobalamin 1000 MCG TBCR Take 1 capsule by mouth daily. 08/16/20  Yes [provider]  estrogens, conjugated, (PREMARIN) 0.3 MG tablet Take 0.3 mg by mouth daily. Take daily for 21 days then do not take for 7 days.   Yes [provider]  FLUoxetine (PROZAC) 40 MG capsule Take 40 mg by mouth daily.   Yes [provider]  ketoconazole (NIZORAL) 2 % shampoo Apply to damp scalp, lather, leave x 5 minutes, then rinse out. Repeat 3x/week.   Yes [provider]  NON FORMULARY TESTOSTERONE  CYPIONATE 200 MG/ML INTRAMUSCULAR OIL   Yes [provider]  NON FORMULARY dilTIAZem (CARDIZEM) 2 % anal ointment   Yes [provider]  Probiotic Product (PROBIOTIC BLEND PO)    Yes [provider]   PROGESTERONE  PO    Yes [provider]  Turmeric 400 MG CAPS Take 1 capsule by mouth daily. 08/16/20  Yes [provider]  Zinc 20 MG CAPS    Yes [provider]    Current Outpatient Medications  Medication Sig Dispense Refill   Cyanocobalamin 1000 MCG TBCR Take 1 capsule by mouth daily.     estrogens, conjugated, (PREMARIN) 0.3 MG tablet Take 0.3 mg by mouth daily. Take daily for 21 days then do not take for 7 days.     FLUoxetine (PROZAC) 40 MG capsule Take 40 mg by mouth daily.     ketoconazole (NIZORAL) 2 % shampoo Apply to damp scalp, lather, leave x 5 minutes, then rinse out. Repeat 3x/week.     NON FORMULARY TESTOSTERONE  CYPIONATE 200 MG/ML INTRAMUSCULAR OIL     NON FORMULARY dilTIAZem (CARDIZEM) 2 % anal ointment     Probiotic Product (PROBIOTIC BLEND PO)      PROGESTERONE  PO      Turmeric 400 MG CAPS Take 1 capsule by mouth daily.     Zinc 20 MG CAPS      Current Facility-Administered Medications  Medication Dose Route Frequency Provider Last Rate Last Admin   0.9 %  sodium chloride  infusion  500 mL Intravenous Once Cresta Riden C, MD        Allergies as of 07/07/2024 - Review Complete 07/07/2024  Allergen Reaction Noted   Ginger Rash 11/08/2017   Other Other (See Comments) 08/26/2017  Family History  Problem Relation Age of Onset   Mental illness Father    Aneurysm Father    Alcohol abuse Father    Drug abuse Father    Depression Brother    Alcohol abuse Brother    Stomach cancer Maternal Grandfather    Stomach cancer Paternal Grandmother    Cancer Paternal Grandmother    Colon cancer Neg Hx    Esophageal cancer Neg Hx    Rectal cancer Neg Hx     Social History   Socioeconomic History   Marital status: Single    Spouse name: Not on file   Number of children: 0   Years of education: Not on file   Highest education level: Not on file  Occupational History   Occupation: Architect  Tobacco Use   Smoking status:  Former    Current packs/day: 0.00    Types: Cigarettes    Start date: 01/23/1989    Quit date: 10/25/2013    Years since quitting: 10.7   Smokeless tobacco: Current  Vaping Use   Vaping status: Never Used  Substance and Sexual Activity   Alcohol use: No    Alcohol/week: 0.0 standard drinks of alcohol    Comment: former    Drug use: No   Sexual activity: Never    Birth control/protection: Abstinence  Other Topics Concern   Not on file  Social History Narrative   Not on file   Social Drivers of Health   Financial Resource Strain: Not on file  Food Insecurity: Not on file  Transportation Needs: Not on file  Physical Activity: Not on file  Stress: Not on file  Social Connections: Not on file  Intimate Partner Violence: Not on file    Physical Exam: Vital signs in last 24 hours: BP 109/74   Pulse 72   Temp 97.9 F (36.6 C)   Ht 5' 11 (1.803 m)   Wt 184 lb (83.5 kg)   SpO2 96%   BMI 25.66 kg/m  GEN: NAD EYE: Sclerae anicteric ENT: MMM CV: Non-tachycardic Pulm: No increased WOB GI: Soft NEURO:  Alert & Oriented   Estefana Kidney, MD [facility]   07/07/2024 8:06 AM

## 2024-07-07 NOTE — Progress Notes (Signed)
 Report to PACU, RN, vss, BBS= Clear.

## 2024-07-07 NOTE — Op Note (Addendum)
 [facility] Patient Name: Allan Bacigalupi Procedure Date: 07/07/2024 8:35 AM MRN: [id number] Endoscopist: Rosario Estefana Kidney , , [id number] Age: 53 Referring MD:  Date of Birth: 17-Jul-1971 Gender: Female Account #: [id number] Procedure:                Colonoscopy Indications:              Follow-up of Crohn's disease of the small bowel and                            colon, history of ileocectomy in 2018 Medicines:                Monitored Anesthesia Care Procedure:                Pre-Anesthesia Assessment:                           - Prior to the procedure, a History and Physical                            was performed, and patient medications and                            allergies were reviewed. The patient's tolerance of                            previous anesthesia was also reviewed. The risks                            and benefits of the procedure and the sedation                            options and risks were discussed with the patient.                            All questions were answered, and informed consent                            was obtained. Prior Anticoagulants: The patient has                            taken no anticoagulant or antiplatelet agents. ASA                            Grade Assessment: II - A patient with mild systemic                            disease. After reviewing the risks and benefits,                            the patient was deemed in satisfactory condition to                            undergo the procedure.  After obtaining informed consent, the colonoscope                            was passed under direct vision. Throughout the                            procedure, the patient's blood pressure, pulse, and                            oxygen saturations were monitored continuously. The                            Olympus Scope SN: [id number] was introduced through                            the anus and  advanced to the the terminal ileum.                            The colonoscopy was performed without difficulty.                            The patient tolerated the procedure well. The                            quality of the bowel preparation was adequate. The                            terminal ileum, the ileocecal valve and the rectum                            were photographed. Scope In: 8:41:21 AM Scope Out: 9:30:44 AM Scope Withdrawal Time: 0 hours 25 minutes 3 seconds  Total Procedure Duration: 0 hours 49 minutes 23 seconds  Findings:                 Localized inflammation characterized by congestion                            (edema), erosions, erythema and shallow ulcerations                            was found in the neo-terminal Ileum. This improved                            as you moved proximally away from the ileocolonic                            anastomosis. Biopsies were taken with a cold                            forceps for histology.                           There was evidence of a prior end-to-side  ileo-colonic anastomosis in the ascending colon.                            This was patent and was characterized by                            inflammation and ulceration. The anastomosis was                            traversed.                           A 4 mm polyp was found in the transverse colon. The                            polyp was sessile. The polyp was removed with a                            cold snare. Resection and retrieval were complete.                           Four biopsies were taken every 10 cm with a cold                            forceps from the entire colon for Crohn's disease                            surveillance. These biopsy specimens from the right                            colon, left colon and transverse colon were sent to                            Pathology.                           Non-bleeding  internal hemorrhoids were found during                            retroflexion. Complications:            No immediate complications. Estimated Blood Loss:     Estimated blood loss was minimal. Impression:               - Inflammation was found in the ileum secondary to                            ileitis. Biopsied.                           - Patent end-to-side ileo-colonic anastomosis,                            characterized by inflammation and ulceration.                           -  One 4 mm polyp in the transverse colon, removed                            with a cold snare. Resected and retrieved.                           - Non-bleeding internal hemorrhoids.                           - Biopsies for surveillance were taken from the                            entire colon. Recommendation:           - Discharge patient to home (with escort).                           - Await pathology results.                           - Return to GI clinic with Dr. Albertus or APP in 1-2                            months.                           - The findings and recommendations were discussed                            with the patient. Dr Estefana Federico Rosario Estefana Federico,  07/07/2024 9:48:49 AM

## 2024-07-07 NOTE — Patient Instructions (Addendum)
 Resume previous diet. Continue present medications. Awaiting pathology results.  Scheduled follow up appointment. Handouts provided on polyps and hemorrhoids.  YOU HAD AN ENDOSCOPIC PROCEDURE TODAY AT THE [facility]:   Refer to the procedure report that was given to you for any specific questions about what was found during the examination.  If the procedure report does not answer your questions, please call your gastroenterologist to clarify.  If you requested that your care partner not be given the details of your procedure findings, then the procedure report has been included in a sealed envelope for you to review at your convenience later.  YOU SHOULD EXPECT: Some feelings of bloating in the abdomen. Passage of more gas than usual.  Walking can help get rid of the air that was put into your GI tract during the procedure and reduce the bloating. If you had a lower endoscopy (such as a colonoscopy or flexible sigmoidoscopy) you may notice spotting of blood in your stool or on the toilet paper. If you underwent a bowel prep for your procedure, you may not have a normal bowel movement for a few days.  Please Note:  You might notice some irritation and congestion in your nose or some drainage.  This is from the oxygen used during your procedure.  There is no need for concern and it should clear up in a day or so.  SYMPTOMS TO REPORT IMMEDIATELY:  Following lower endoscopy (colonoscopy or flexible sigmoidoscopy):  Excessive amounts of blood in the stool  Significant tenderness or worsening of abdominal pains  Swelling of the abdomen that is new, acute  Fever of 100F or higher   For urgent or emergent issues, a gastroenterologist can be reached at any hour by calling [phone]. Do not use MyChart messaging for urgent concerns.    DIET:  We do recommend a small meal at first, but then you may proceed to your regular diet.  Drink plenty of fluids but you should avoid alcoholic  beverages for 24 hours.  ACTIVITY:  You should plan to take it easy for the rest of today and you should NOT DRIVE or use heavy machinery until tomorrow (because of the sedation medicines used during the test).    FOLLOW UP: Our staff will call the number listed on your records the next business day following your procedure.  We will call around 7:15- 8:00 am to check on you and address any questions or concerns that you may have regarding the information given to you following your procedure. If we do not reach you, we will leave a message.     If any biopsies were taken you will be contacted by phone or by letter within the next 1-3 weeks.  Please call us  at [phone] if you have not heard about the biopsies in 3 weeks.    SIGNATURES/CONFIDENTIALITY: You and/or your care partner have signed paperwork which will be entered into your electronic medical record.  These signatures attest to the fact that that the information above on your After Visit Summary has been reviewed and is understood.  Full responsibility of the confidentiality of this discharge information lies with you and/or your care-partner.

## 2024-07-08 ENCOUNTER — Telehealth: Payer: Self-pay

## 2024-07-08 NOTE — Telephone Encounter (Signed)
 An answer on follow up call

## 2024-07-11 ENCOUNTER — Telehealth: Payer: Self-pay | Admitting: Internal Medicine

## 2024-07-11 ENCOUNTER — Ambulatory Visit: Payer: Self-pay | Admitting: Internal Medicine

## 2024-07-11 LAB — SURGICAL PATHOLOGY

## 2024-07-11 NOTE — Telephone Encounter (Signed)
 Left message for pt to call back

## 2024-07-11 NOTE — Telephone Encounter (Signed)
 Inbound call from patient states once results are available please f/u with primary care. Also requesting f/u call please advise.   Thank you

## 2024-07-11 NOTE — Telephone Encounter (Signed)
 Pt was made aware that the provider has not reviewed the Path results yet and that as soon as they review the results she will be made aware. Pt was notified at that time to request for the results to be faxed to her PCP.  Pt verbalized understanding with all questions answered.

## 2024-07-12 NOTE — Telephone Encounter (Signed)
 Inbound call from referring office that they would like us  to send over results from colonoscopy once Dr. Federico has reviewed path results.

## 2024-07-13 NOTE — Telephone Encounter (Signed)
 Colonoscopy and path report routed to Pt PCP through Epic.  Left pt a voice mail notifying pt that the  Colonoscopy and Path were routed over to her PCP

## 2024-09-07 NOTE — Progress Notes (Unsigned)
 09/07/2024 [name] [id number] [birth year]   Chief Complaint:  History of Present Illness: Helen [name] is a 53 year old female with a past medical history of anxiety, depression, alcohol use disorder (abstinent x 23 years), anal fissure and Crohn's disease of both small and large intestine initially diagnosed in 1991 treated with Asacol x 5 or 6 years and subsequent developed a small bowel obstruction at the ileocecal valve s/p ileocecectomy 10/07/2017 by Dr. DOROTHA Brochure who resected a short segment of the IC valve where her Crohn's disease caused a short fibrotic stricture. She was previously followed by Dr. Nancee Plough who retired therefore she transitioned her GI management with Dr. Albertus 03/2024.  She previously reported her last colonoscopy was done in 2019, several months after her ileocecectomy surgery and she was advised by Dr. Plough to repeat a colonoscopy in 10 years. She has not been seen by Dr. Plough since completing the colonoscopy in 2019. She has a history of an anal fissure associated with past constipation followed by colorectal surgery. It is unclear at this time if her anal fissure is a manifestation of anorectal Crohn's disease or simply a fissure from past constipation. She had recurrent rectal pain and bleeding 10/2023, stress level was elevated at that time with aging parents and going through perimenopause. Seen by colorectal surgery 01/19/2024, prescribed Diltiazem fissure ointment, referred to pelvic floor physical therapy and possible botox injections considered if her anal fissure did not resolve. She passes a normal formed brown stool once daily, sometimes skips a day. She often has abdominal bloat. She takes 3 probiotics. No known family history of IBD or colorectal cancer.       Latest Ref Rng & Units 03/04/2024    2:31 PM 05/21/2018   10:45 AM 11/04/2017    5:54 PM  CBC  WBC 4.0 - 10.5 K/uL 7.1  4.7  6.9   Hemoglobin 12.0 - 15.0 g/dL 86.7  88.3  88.2    Hematocrit 36.0 - 46.0 % 39.2  35.4  35.9   Platelets 150.0 - 400.0 K/uL 203.0  173         Latest Ref Rng & Units 03/04/2024    2:31 PM 05/21/2018   10:45 AM 11/04/2017    5:55 PM  CMP  Glucose 70 - 99 mg/dL 92  77  90   BUN 6 - 23 mg/dL 12  11  9    Creatinine 0.40 - 1.20 mg/dL 9.16  9.28  9.17   Sodium 135 - 145 mEq/L 137  138  142   Potassium 3.5 - 5.1 mEq/L 4.4  4.4  4.4   Chloride 96 - 112 mEq/L 104  104  105   CO2 19 - 32 mEq/L 28  24  23    Calcium 8.4 - 10.5 mg/dL 9.1  8.6  9.2   Total Protein 6.0 - 8.3 g/dL 6.9  6.2    Total Bilirubin 0.2 - 1.2 mg/dL 0.3  0.3    Alkaline Phos 39 - 117 U/L 45  34    AST 0 - 37 U/L 16  15    ALT 0 - 35 U/L 12  10      Colonoscopy 07/07/2024 by Dr. Federico: - Inflammation was found in the ileum secondary to ileitis. Biopsied.  - Patent end-to-side ileo-colonic anastomosis, characterized by inflammation and ulceration.  - One 4 mm polyp in the transverse colon, removed with a cold snare. Resected and retrieved.  - Non-bleeding internal hemorrhoids.  - Biopsies  for surveillance were taken from the entire colon. - Follow-up with Dr. Albertus or APP in 1 to 2 months  1. Surgical [P], colon, ileal biopsy - evaluate for Crohns :      - BENIGN ILEAL MUCOSA WITH ACUTE INFLAMMATION AND ULCERATION CONSISTENT WITH      ANASTOMOTIC SITE      - NEGATIVE FOR FEATURES OF CHRONICITY, GRANULOMAS OR VIRAL CYTOPATHIC EFFECT      - NEGATIVE FOR DYSPLASIA OR MALIGNANCY       2. Surgical [P], right colon biopsy :      - COLONIC MUCOSA WITH MILD LAMINA PROPRIA EDEMA      - NEGATIVE FOR FEATURES OF CHRONICITY OR ACUTE INFLAMMATION      - NEGATIVE FOR DYSPLASIA OR MALIGNANCY       3. Surgical [P], colon, transverse, polyp (1) :      - SESSILE SERRATED POLYP.      - NO DYSPLASIA OR MALIGNANCY.       4. Surgical [P], colon, transverse :      - COLONIC MUCOSA WITH MILD LAMINA PROPRIA EDEMA      - NEGATIVE FOR FEATURES OF CHRONICITY OR ACUTE INFLAMMATION      -  NEGATIVE FOR DYSPLASIA OR MALIGNANCY       5. Surgical [P], left colon biopsy :      - COLONIC MUCOSA WITH MILD LAMINA PROPRIA EDEMA      - NEGATIVE FOR FEATURES OF CHRONICITY OR ACUTE INFLAMMATION      - NEGATIVE FOR DYSPLASIA OR MALIGNANCY  The polyp removed from your colon was benign, but precancerous. This means that it had the potential to change into cancer over time had it not been removed.  The biopsies of your small bowel showed inflammation due to your prior surgery. There were no clear signs of active Crohn's disease, which is good news. The rest of your colon biopsies were normal.   I recommend you have a repeat colonoscopy in 3 years with Dr. Albertus to determine if you have developed any new polyps and to screen for colorectal cancer.       Past Medical History:  Diagnosis Date   Alcohol abuse    Allergy    Crohn disease (HCC)    Past Surgical History:  Procedure Laterality Date   COLONOSCOPY     LAPAROSCOPIC ILEOCECECTOMY  2018       Current Medications, Allergies, Past Medical History, Past Surgical History, Family History and Social History were reviewed in [employer] record.   Review of Systems:   Constitutional: Negative for fever, sweats, chills or weight loss.  Respiratory: Negative for shortness of breath.   Cardiovascular: Negative for chest pain, palpitations and leg swelling.  Gastrointestinal: See HPI.  Musculoskeletal: Negative for back pain or muscle aches.  Neurological: Negative for dizziness, headaches or paresthesias.    Physical Exam: There were no vitals taken for this visit. General: in no acute distress. Head: Normocephalic and atraumatic. Eyes: No scleral icterus. Conjunctiva pink . Ears: Normal auditory acuity. Mouth: Dentition intact. No ulcers or lesions.  Lungs: Clear throughout to auscultation. Heart: Regular rate and rhythm, no murmur. Abdomen: Soft, nontender and nondistended. No masses or  hepatomegaly. Normal bowel sounds x 4 quadrants.  Rectal: *** Musculoskeletal: Symmetrical with no gross deformities. Extremities: No edema. Neurological: Alert oriented x 4. No focal deficits.  Psychological: Alert and cooperative. Normal mood and affect  Assessment and Recommendations:  53 year old female with  Crohn's disease of both small and large intestine initially diagnosed in 1991 treated with Asacol x 5 or 6 years then off treatment and subsequently developed a small bowel obstruction at the ileocecal valve secondary to a short fibrotic stricture s/p ileocecectomy 10/07/2017.

## 2024-09-08 ENCOUNTER — Ambulatory Visit (INDEPENDENT_AMBULATORY_CARE_PROVIDER_SITE_OTHER): Admitting: Nurse Practitioner

## 2024-09-08 ENCOUNTER — Encounter: Payer: Self-pay | Admitting: Nurse Practitioner

## 2024-09-08 VITALS — BP 120/72 | HR 63 | Ht 71.0 in | Wt 184.6 lb

## 2024-09-08 DIAGNOSIS — K5900 Constipation, unspecified: Secondary | ICD-10-CM

## 2024-09-08 NOTE — Patient Instructions (Addendum)
 Please purchase the following medications over the counter and take as directed: Benefiber one tablespoon once daily every morning.  Take Miralax  1 capful mixed in 8 ounces of water at bed time for constipation as tolerated.  Thank you for trusting me with your gastrointestinal care!   Elida Shawl, CRNP   _______________________________________________________  If your blood pressure at your visit was 140/90 or greater, please contact your primary care physician to follow up on this.  _______________________________________________________  If you are age 52 or older, your body mass index should be between 23-30. Your Body mass index is 25.75 kg/m. If this is out of the aforementioned range listed, please consider follow up with your Primary Care Provider.  If you are age 70 or younger, your body mass index should be between 19-25. Your Body mass index is 25.75 kg/m. If this is out of the aformentioned range listed, please consider follow up with your Primary Care Provider.   ________________________________________________________  The [facility] providers would like to encourage you to use MYCHART to communicate with providers for non-urgent requests or questions.  Due to long hold times on the telephone, sending your provider a message by [facility] may be a faster and more efficient way to get a response.  Please allow 48 business hours for a response.  Please remember that this is for non-urgent requests.  _______________________________________________________  [facility] is using a team-based approach to care.  Your team is made up of your doctor and two to three APPS. Our APPS (Nurse Practitioners and Physician Assistants) work with your physician to ensure care continuity for you. They are fully qualified to address your health concerns and develop a treatment plan. They communicate directly with your gastroenterologist to care for you. Seeing the [facility] Practitioners on your physician's team can help you by facilitating care more promptly, often allowing for earlier appointments, access to diagnostic testing, procedures, and other specialty referrals.

## 2024-11-03 NOTE — Progress Notes (Deleted)
 "    11/03/2024 Helen [name] [id number] 1970/11/10   Chief Complaint: Constipation   History of Present Illness: Helen [name] is a 54 year old female with a past medical history of anxiety, depression, alcohol use disorder (abstinent x 23 years), anal fissure and Crohn's disease of both small and large intestine initially diagnosed in 1991 treated with Asacol x 5 or 6 years and subsequent developed a small bowel obstruction at the ileocecal valve s/p ileocecectomy 10/07/2017 by Dr. DOROTHA Brochure who resected a short segment of the IC valve where her Crohn's disease caused a short fibrotic stricture. She was previously followed by Dr. Nancee Plough who retired therefore she transitioned her GI management with Dr. Albertus 03/2024.    Discussed the use of AI scribe software for clinical note transcription with the patient, who gave verbal consent to proceed.  History of Present Illness      Latest Ref Rng & Units 03/04/2024    2:31 PM 05/21/2018   10:45 AM 11/04/2017    5:54 PM  CBC  WBC 4.0 - 10.5 K/uL 7.1  4.7  6.9   Hemoglobin 12.0 - 15.0 g/dL 86.7  88.3  88.2   Hematocrit 36.0 - 46.0 % 39.2  35.4  35.9   Platelets 150.0 - 400.0 K/uL 203.0  173         Latest Ref Rng & Units 03/04/2024    2:31 PM 05/21/2018   10:45 AM 11/04/2017    5:55 PM  CMP  Glucose 70 - 99 mg/dL 92  77  90   BUN 6 - 23 mg/dL 12  11  9    Creatinine 0.40 - 1.20 mg/dL 9.16  9.28  9.17   Sodium 135 - 145 mEq/L 137  138  142   Potassium 3.5 - 5.1 mEq/L 4.4  4.4  4.4   Chloride 96 - 112 mEq/L 104  104  105   CO2 19 - 32 mEq/L 28  24  23    Calcium 8.4 - 10.5 mg/dL 9.1  8.6  9.2   Total Protein 6.0 - 8.3 g/dL 6.9  6.2    Total Bilirubin 0.2 - 1.2 mg/dL 0.3  0.3    Alkaline Phos 39 - 117 U/L 45  34    AST 0 - 37 U/L 16  15    ALT 0 - 35 U/L 12  10      GI PROCEDURES:  Colonoscopy 07/07/2024 by Dr. Federico: - Inflammation was found in the ileum secondary to ileitis. Biopsied.  - Patent end-to-side ileo-colonic anastomosis,  characterized by inflammation and ulceration.  - One 4 mm polyp in the transverse colon, removed with a cold snare. Resected and retrieved.  - Non-bleeding internal hemorrhoids.  - Biopsies for surveillance were taken from the entire colon. - Follow-up with Dr. Albertus or APP in 1 to 2 months   1. Surgical [P], colon, ileal biopsy - evaluate for Crohns :      - BENIGN ILEAL MUCOSA WITH ACUTE INFLAMMATION AND ULCERATION CONSISTENT WITH      ANASTOMOTIC SITE      - NEGATIVE FOR FEATURES OF CHRONICITY, GRANULOMAS OR VIRAL CYTOPATHIC EFFECT      - NEGATIVE FOR DYSPLASIA OR MALIGNANCY       2. Surgical [P], right colon biopsy :      - COLONIC MUCOSA WITH MILD LAMINA PROPRIA EDEMA      - NEGATIVE FOR FEATURES OF CHRONICITY OR ACUTE INFLAMMATION      - NEGATIVE  FOR DYSPLASIA OR MALIGNANCY       3. Surgical [P], colon, transverse, polyp (1) :      - SESSILE SERRATED POLYP.      - NO DYSPLASIA OR MALIGNANCY.       4. Surgical [P], colon, transverse :      - COLONIC MUCOSA WITH MILD LAMINA PROPRIA EDEMA      - NEGATIVE FOR FEATURES OF CHRONICITY OR ACUTE INFLAMMATION      - NEGATIVE FOR DYSPLASIA OR MALIGNANCY       5. Surgical [P], left colon biopsy :      - COLONIC MUCOSA WITH MILD LAMINA PROPRIA EDEMA      - NEGATIVE FOR FEATURES OF CHRONICITY OR ACUTE INFLAMMATION      - NEGATIVE FOR DYSPLASIA OR MALIGNANCY      Past Medical History:  Diagnosis Date   Alcohol abuse    Allergy    Crohn disease (HCC)    Past Surgical History:  Procedure Laterality Date   COLONOSCOPY     LAPAROSCOPIC ILEOCECECTOMY  2018     Current Medications, Allergies, Past Medical History, Past Surgical History, Family History and Social History were reviewed in [employer] record.   Review of Systems:   Constitutional: Negative for fever, sweats, chills or weight loss.  Respiratory: Negative for shortness of breath.   Cardiovascular: Negative for chest pain, palpitations and leg  swelling.  Gastrointestinal: See HPI.  Musculoskeletal: Negative for back pain or muscle aches.  Neurological: Negative for dizziness, headaches or paresthesias.    Physical Exam: There were no vitals taken for this visit. General: in no acute distress. Head: Normocephalic and atraumatic. Eyes: No scleral icterus. Conjunctiva pink . Ears: Normal auditory acuity. Mouth: Dentition intact. No ulcers or lesions.  Lungs: Clear throughout to auscultation. Heart: Regular rate and rhythm, no murmur. Abdomen: Soft, nontender and nondistended. No masses or hepatomegaly. Normal bowel sounds x 4 quadrants.  Rectal: *** Musculoskeletal: Symmetrical with no gross deformities. Extremities: No edema. Neurological: Alert oriented x 4. No focal deficits.  Psychological: Alert and cooperative. Normal mood and affect  Assessment and Recommendations:  54 year old female with Crohn's disease of both small and large intestine initially diagnosed in 1991 treated with Asacol x 5 or 6 years then off treatment and subsequently developed a small bowel obstruction at the ileocecal valve secondary to a short fibrotic stricture s/p ileocecectomy 10/07/2017. Colonoscopy attempted 07/06/2024, aborted due to a  large amount of semisolid stool was in the rectum and sigmoid colon. She received additional bowel prep. She underwent a colonoscopy 07/07/2024 by Dr. Federico which showed inflammation in the ileum secondary to ileitis, patent end-to side ileocolonic anastomosis with inflammation and ulceration, one 4 mm sessile serrated polyp was removed from the transverse colon and nonbleeding internal hemorrhoids.  Ileal biopsies showed benign mucosa with acute inflammation and ulceration consistent with anastomotic site prior surgery and no clear signs of active Crohn's disease.  Not on Crohn's treatment. - Avoid NSAID - Next surveillance colonoscopy due 07/2027   Constipation, intermittent narrow stools - Reduce sugar/sweet  intake - Continue to exercise as tolerated - Benefiber 1 tablespoon daily as tolerated - Miralax  nightly as needed as tolerated - Drink 64 ounces of water daily - Patient elects to follow-up in 6 weeks   Colon polyps.  Colonoscopy 07/2024 identified 1 sessile serrated polyp removed from the transverse colon. - Next surveillance colonoscopy due 07/2027   History of anal fissure, resolved    "

## 2024-11-04 ENCOUNTER — Ambulatory Visit: Admitting: Nurse Practitioner
